# Patient Record
Sex: Female | Born: 1950 | Race: White | Hispanic: No | Marital: Single | Smoking: Never smoker
Health system: Southern US, Community
[De-identification: ages and names within clinical notes are randomized; demographics above are authoritative.]

## PROBLEM LIST (undated history)

## (undated) DIAGNOSIS — F40231 Fear of injections and transfusions: Secondary | ICD-10-CM

## (undated) DIAGNOSIS — I10 Essential (primary) hypertension: Secondary | ICD-10-CM

## (undated) DIAGNOSIS — E785 Hyperlipidemia, unspecified: Secondary | ICD-10-CM

## (undated) DIAGNOSIS — I639 Cerebral infarction, unspecified: Secondary | ICD-10-CM

## (undated) DIAGNOSIS — N289 Disorder of kidney and ureter, unspecified: Secondary | ICD-10-CM

## (undated) HISTORY — DX: Disorder of kidney and ureter, unspecified: N28.9

## (undated) HISTORY — DX: Hyperlipidemia, unspecified: E78.5

## (undated) HISTORY — PX: ABDOMINAL HYSTERECTOMY: SHX81

## (undated) HISTORY — DX: Fear of injections and transfusions: F40.231

## (undated) HISTORY — DX: Essential (primary) hypertension: I10

## (undated) HISTORY — DX: Cerebral infarction, unspecified: I63.9

## (undated) HISTORY — PX: KNEE ARTHROSCOPY W/ ACL RECONSTRUCTION: SHX1858

---

## 2017-12-15 ENCOUNTER — Ambulatory Visit (INDEPENDENT_AMBULATORY_CARE_PROVIDER_SITE_OTHER): Payer: Medicare Other | Admitting: Neurology

## 2017-12-15 ENCOUNTER — Encounter: Payer: Self-pay | Admitting: Neurology

## 2017-12-15 VITALS — BP 140/84 | HR 92 | Ht 63.0 in | Wt 140.2 lb

## 2017-12-15 DIAGNOSIS — I10 Essential (primary) hypertension: Secondary | ICD-10-CM

## 2017-12-15 DIAGNOSIS — I63512 Cerebral infarction due to unspecified occlusion or stenosis of left middle cerebral artery: Secondary | ICD-10-CM | POA: Diagnosis not present

## 2017-12-15 DIAGNOSIS — I679 Cerebrovascular disease, unspecified: Secondary | ICD-10-CM | POA: Diagnosis not present

## 2017-12-15 DIAGNOSIS — R432 Parageusia: Secondary | ICD-10-CM

## 2017-12-15 MED ORDER — ATORVASTATIN CALCIUM 20 MG PO TABS: 20.0000 mg | ORAL_TABLET | Freq: Every day | ORAL | 5 refills | Status: DC

## 2017-12-15 NOTE — Patient Instructions (Signed)
-   continue plavix and stop ASA  - will decrease lipitor from 80 to 20mg  - continue BP meds and check BP at home and record - Follow up with your primary care physician for stroke risk factor modification. Recommend maintain blood pressure goal <130/80, diabetes with hemoglobin A1c goal below 7.0% and lipids with LDL cholesterol goal below 70 mg/dL.  - will arrange for 30 day heart monitoring to rule out afib - eat more, eat healthy and keep active.  - follow up in 3 months.

## 2017-12-16 DIAGNOSIS — I10 Essential (primary) hypertension: Secondary | ICD-10-CM | POA: Insufficient documentation

## 2017-12-16 DIAGNOSIS — R432 Parageusia: Secondary | ICD-10-CM | POA: Insufficient documentation

## 2017-12-16 DIAGNOSIS — I63512 Cerebral infarction due to unspecified occlusion or stenosis of left middle cerebral artery: Secondary | ICD-10-CM | POA: Insufficient documentation

## 2017-12-16 NOTE — Progress Notes (Signed)
NEUROLOGY CLINIC NEW PATIENT NOTE  NAME: Denise Harding DOB: February 02, 1951 REFERRING PHYSICIAN: Bernerd PhoPodraza, Cole Christoph*  I saw Denise Harding as a new consult in the neurovascular clinic today regarding  Chief Complaint  Patient presents with  . New Patient (Initial Visit)    Referral from  Dr. Rudy Jewhristopher Cole Podraza [facility] last seen 05/21/2017 by  Denise Harding.[facility]. Pt concerned for possbile residual sympts of bitter tast in mouth, dry mouth,and abdnormal sensations in finger tips following her stroke Stroke was 12/2016  .  HPI: Denise Harding is a 67 y.o. female with PMH of HTN who presents as a new patient for a stroke.   on 05/09/2017, she was [facility] for some facial tingling and numbness and was admitted overnight. MRI negative for acute stroke and was diagnosed as TIA. On 05/13/17,she started having right sided weakness and speech difficulty. EMS was called and she was brought to the ER. In the ER, most of the motor symptoms had improved but she did have right sided drift. CT head showed infarction left caudate lobe. TPA was given and patient was admitted to the ICU. Patient had some headache at the end of the tPA infusion. Repeat CT scan of the head showed SAH along the sulci along the anterior and posterior hemisphere convexities on the left side. 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 CTA head and neck showed left M1 occlusion vs. High grade stenosis, right M1 mild to moderate stenosis. Otherwise not remarkable. A repeat CT scan was done and she had stable findings. Her antiplatelet was on hold pending outpatient neurology follow-up in 1 week. Blood pressures were well controlled after restarting amlodipine 10 mg. started on high intensity statin with Lipitor 80 mg.  She will [facility] and she started patient on Plavix and continued ASA and lipitor. Dr. Anne HahnWillis also referred her to rheumatology  as her ANA and ANCA was positive. She saw rheumatology later ruled out any lupus but concerning for afib. She had 24h holter monitoring which did not show afib. Dr. Anne HahnWillis later left the service so she is now referred here for stroke follow up.   Pt stated that over the last 6 months, she started to be easily bruising. Lost 40lbs as she does not have good appetite due to bitter taste and dry month which started after she takes ASA and plavix and lipitor. She has been on amlodipine for a long time, does not think amlodipine caused it.   She denies any hx of afib. She stated that at 9th grade, she had heart murmur and [facility] but did not find anything. Her BP at home stable 120-130/80. Today 140/82 in clinic. Denies smoking, alcohol or illicit drugs.  Past Medical History:  Diagnosis Date  . Hyperlipidemia   . Hypertension   . Renal insufficiency   . Severe needle phobia   . Stroke [facility](HCC)    History reviewed. No pertinent surgical history. History reviewed. No pertinent family history. Current Outpatient Medications  Medication Sig Dispense Refill  . amLODipine (NORVASC) 10 MG tablet Take 10 mg by mouth.    Marland Kitchen. amLODipine-benazepril (LOTREL) 10-20 MG capsule Take by mouth.    Marland Kitchen. aspirin EC 81 MG tablet Take by mouth.    Marland Kitchen. atorvastatin (LIPITOR) 20 MG tablet Take 1 tablet (20 mg total) by mouth daily at 6 PM. 30 tablet 5  . clopidogrel (PLAVIX) 75  MG tablet Take by mouth.    Tery Sanfilippo Sodium (COLACE PO) Take by mouth.     No current facility-administered medications for this visit.    No Known Allergies Social History   Socioeconomic History  . Marital status: Single    Spouse name: Not on file  . Number of children: Not on file  . Years of education: Not on file  . Highest education level: Not on file  Social Needs  . Financial resource strain: Not on file  . Food insecurity - worry: Not on file  . Food insecurity - inability: Not on file  . Transportation needs -  medical: Not on file  . Transportation needs - non-medical: Not on file  Occupational History  . Not on file  Tobacco Use  . Smoking status: Never Smoker  . Smokeless tobacco: Never Used  Substance and Sexual Activity  . Alcohol use: Not on file    Comment: occasionally  . Drug use: No  . Sexual activity: Not on file  Other Topics Concern  . Not on file  Social History Narrative  . Not on file    Review of Systems Full 14 system review of systems performed and notable only for those listed, all others are neg:  Constitutional:   Cardiovascular: Heart murmur Ear/Nose/Throat: Hearing loss, ringing in ears Skin:  Eyes:   Respiratory:   Gastroitestinal:   Genitourinary:  Hematology/Lymphatic:   Endocrine: Increased thirst Musculoskeletal:   Allergy/Immunology:   Neurological: Confusion, headache Psychiatric:  Sleep: Restless legs   Physical Exam  Vitals:   12/15/17 1100  BP: 140/84  Pulse: 92    General - Well nourished, well developed, in no apparent distress.  Ophthalmologic - Sharp disc margins OU.  Cardiovascular - Regular rate and rhythm with no murmur. Carotid pulses were 2+ without bruits .   Neck - supple, no nuchal rigidity .  Mental Status -  Level of arousal and orientation to time, place, and person were intact. Language including expression, naming, repetition, comprehension, reading, and writing was assessed and found intact. Attention span and concentration were normal. Recent and remote memory were intact. Fund of Knowledge was assessed and was intact.  Cranial Nerves II - XII - II - Visual field intact OU. III, IV, VI - Extraocular movements intact. V - Facial sensation intact bilaterally. VII - Facial movement intact bilaterally. VIII - Hearing & vestibular intact bilaterally. X - Palate elevates symmetrically. XI - Chin turning & shoulder shrug intact bilaterally. XII - Tongue protrusion intact.  Motor Strength - The patient's  strength was normal in all extremities and pronator drift was absent.  Bulk was normal and fasciculations were absent.   Motor Tone - Muscle tone was assessed at the neck and appendages and was normal.  Reflexes - The patient's reflexes were normal in all extremities and she had no pathological reflexes.  Sensory - Light touch, temperature/pinprick, vibration and proprioception, and Romberg testing were assessed and were normal.    Coordination - The patient had normal movements in the hands and feet with no ataxia or dysmetria.  Tremor was absent.  Gait and Station - The patient's transfers, posture, gait, station, and turns were observed as normal.   Imaging  I have personally reviewed the radiological images below and agree with the radiology interpretations.  MRI HEAD WITHOUT CONTRAST Patchy areas of acute infarct in the left basal ganglia and left occipital parietal white matter consistent with severe stenosis left middle cerebral artery on  CTA. Negative for hemorrhage.  CT head 05/13/17 1.  Stable to slightly improved trace SAH within the sulci along the left frontal and occipital complex disease 2.  No evidence of new intracranial hemorrhage or new acute abnormality 3.  Evolving low-attenuation lesions in the left BG consistent with known acute/subacute infarcts  CT Head Wo Contrast 05/13/17 1. Trace if any residual blood or hyperdensity in sulci along the left anterior and posterior hemisphere convexities. No other intracranial hemorrhage identified. 2. Minimal to mild hypodensity corresponding to the patchy left hemisphere acute infarcts seen by MRI today. 3. No new intracranial abnormality.   CTA head and neck 05/12/17 1.  Interval development of small volume of SAH over the left cerebral convexity 2.  Stable lucency in the left caudate head and anterior putamen suspicious for acute/subacute infarction 3.  No evidence for new large territory infarct in the brain or  significant mass-effect 4.  Severe distal left M1 stenosis.  Asymmetric mildly diminished left M2 and distal left MCA circulation compared with right probably due to flow limitation from severe distal left M1 stenosis 5.  Moderate proximal right M1 stenosis 6.  Patent carotid and vertebral arteries.  No large vessel occlusion, aneurysm, or significant stenosis is identified.  TTE  Ejection fraction is visually estimated at 55-60% Normal left ventricular systolic function Normal right ventricular size and function. No significant valvular abnormalities  Lab Review ANA 1:320 ANCA positive A1c 5.3 LDL 107    Assessment and Plan:   In summary, Merri Dimaano is a 67 y.o. female with PMH of HTN who presents as a new patient for a stroke on 05/13/17 with right sided weakness and speech difficulty. TPA was given and Repeat CT scan of the head showed SAH left frontal and occipital. 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 CTA head and neck showed left M1 occlusion vs. High grade stenosis, right M1 mild to moderate stenosis. EF 55-60%. LDL 107 and A1C 5.3. Repeat CT stable and SAH resolving. Discharged with Lipitor 80 mg. She followed with Dr. Anne Hahn in [facility] and she started patient on ASA and Plavix and continued lipitor. Dr. Anne Hahn also referred her to rheumatology as her ANA and ANCA was positive. She saw rheumatology later ruled out any lupus but concerning for afib. She had 24h holter monitoring which did not show afib.   Pt complains of 40lbs loss as she does not have good appetite due to bitter taste and dry month which started after she takes ASA and plavix and lipitor. This is more concerning for medication side effects, instead of any stroke concerns. will make medication changes. However, need to rule out afib with longer cardiac monitoring. Pt is needle phobia, will not do loop recorder.  - continue plavix and stop ASA  - will decrease lipitor from 80 to 20mg  - continue  BP meds and check BP at home and record - Follow up with your primary care physician for stroke risk factor modification. Recommend maintain blood pressure goal <130/80, diabetes with hemoglobin A1c goal below 7.0% and lipids with LDL cholesterol goal below 70 mg/dL.  - will arrange for 30 day heart monitoring to rule out afib - eat more, eat healthy and keep active.  - follow up in 3 months.   I recommend aggressive blood pressure control with a goal <130/80 mm Hg.  Lipids should be managed intensively, with a goal LDL < 70 mg/dL.  I encouraged the patient to discuss these important issues with  her primary care physician.  I counseled the patient on measures to reduce stroke risk, including the importance of medication compliance, risk factor control, exercise, healthy diet, and avoidance of smoking.  I reviewed stroke warning signs and symptoms and appropriate actions to take if such occurs.   Thank you very much for the opportunity to participate in the care of this patient.  Please do not hesitate to call if any questions or concerns arise.  No orders of the defined types were placed in this encounter.   Meds ordered this encounter  Medications  . atorvastatin (LIPITOR) 20 MG tablet    Sig: Take 1 tablet (20 mg total) by mouth daily at 6 PM.    Dispense:  30 tablet    Refill:  5    Patient Instructions  - continue plavix and stop ASA  - will decrease lipitor from 80 to 20mg  - continue BP meds and check BP at home and record - Follow up with your primary care physician for stroke risk factor modification. Recommend maintain blood pressure goal <130/80, diabetes with hemoglobin A1c goal below 7.0% and lipids with LDL cholesterol goal below 70 mg/dL.  - will arrange for 30 day heart monitoring to rule out afib - eat more, eat healthy and keep active.  - follow up in 3 months.    Marvel Plan, MD PhD [facility] [address] [place], [place] [address] [phone]

## 2018-01-06 ENCOUNTER — Telehealth: Payer: Self-pay | Admitting: Neurology

## 2018-01-06 DIAGNOSIS — R002 Palpitations: Secondary | ICD-10-CM

## 2018-01-06 NOTE — Telephone Encounter (Signed)
Patient schedule for cardiac event monitor on 01/19/2018.

## 2018-01-06 NOTE — Telephone Encounter (Signed)
Pt calling stating that Dr. Roda ShuttersXu wanted her to wear a heart monitor. In office notes it states "will arrange for 30 day heart monitoring to rule out afib" but pt hasnt heard anything else about it. Please contact to let her know the next steps

## 2018-01-06 NOTE — Telephone Encounter (Signed)
That is strange. I should have put in. Yes, we still need that to be done. Will re-order. Thanks.   Marvel PlanJindong Nirav Sweda, MD PhD Stroke Neurology 01/06/2018 3:48 PM  Orders Placed This Encounter  Procedures  . CARDIAC EVENT MONITOR    Standing Status:   Future    Standing Expiration Date:   01/07/2019    Scheduling Instructions:     Request cardionet setup. Thank you.    Order Specific Question:   Where should this test be performed?    Answer:   [facility]

## 2018-01-19 ENCOUNTER — Ambulatory Visit (INDEPENDENT_AMBULATORY_CARE_PROVIDER_SITE_OTHER): Payer: Medicare Other

## 2018-01-19 ENCOUNTER — Other Ambulatory Visit: Payer: Self-pay | Admitting: Neurology

## 2018-01-19 DIAGNOSIS — I4891 Unspecified atrial fibrillation: Secondary | ICD-10-CM

## 2018-01-19 DIAGNOSIS — R002 Palpitations: Secondary | ICD-10-CM

## 2018-01-19 DIAGNOSIS — I639 Cerebral infarction, unspecified: Secondary | ICD-10-CM

## 2018-02-25 ENCOUNTER — Telehealth: Payer: Self-pay

## 2018-02-25 NOTE — Telephone Encounter (Signed)
Left vm for patient to call back about cardiac monitor results. ------ 

## 2018-02-25 NOTE — Telephone Encounter (Signed)
Pt returning RN's call.

## 2018-02-25 NOTE — Telephone Encounter (Signed)
Rn call patient that her 30 day monitor showed no irregular heart beat, no atrial fibrillation. No medication changes. Continue treatment plan. Pt verbalized understanding. Pt wanted a copy mailed to her. Rn advised pt she has to sign medical records release form with medical records at our office to get a copy. PT states she will sign form at next visit to get copy. PT verbalized understanding. ------

## 2018-02-25 NOTE — Telephone Encounter (Signed)
-----   Message from Jindong Xu, MD sent at 02/24/2018 10:09 PM EDT ----- Could you please let the patient know that the 30 day heart monitoring test done recently showed no irregular heart beats called atrial fibrillation. No medication change needed. Please continue current treatment. Thanks.  Jindong Xu, MD PhD Stroke Neurology 02/24/2018 10:07 PM 

## 2018-02-25 NOTE — Telephone Encounter (Signed)
-----   Message from Marvel PlanJindong Xu, MD sent at 02/24/2018 10:09 PM EDT ----- Could you please let the patient know that the 30 day heart monitoring test done recently showed no irregular heart beats called atrial fibrillation. No medication change needed. Please continue current treatment. Thanks.  Marvel PlanJindong Xu, MD PhD Stroke Neurology 02/24/2018 10:07 PM

## 2018-03-22 ENCOUNTER — Ambulatory Visit (INDEPENDENT_AMBULATORY_CARE_PROVIDER_SITE_OTHER): Payer: Medicare Other | Admitting: Neurology

## 2018-03-22 ENCOUNTER — Encounter: Payer: Self-pay | Admitting: Neurology

## 2018-03-22 VITALS — BP 145/83 | HR 83 | Ht 63.0 in | Wt 137.2 lb

## 2018-03-22 DIAGNOSIS — E785 Hyperlipidemia, unspecified: Secondary | ICD-10-CM | POA: Diagnosis not present

## 2018-03-22 DIAGNOSIS — I639 Cerebral infarction, unspecified: Secondary | ICD-10-CM

## 2018-03-22 DIAGNOSIS — I10 Essential (primary) hypertension: Secondary | ICD-10-CM | POA: Diagnosis not present

## 2018-03-22 DIAGNOSIS — I63512 Cerebral infarction due to unspecified occlusion or stenosis of left middle cerebral artery: Secondary | ICD-10-CM

## 2018-03-22 NOTE — Patient Instructions (Signed)
-   continue plavix and lipitor for stroke prevention - continue BP meds and check BP at home and record - BP goal 130-150 given vessel narrowing in the brain - recommend BP cuff in arms instead of wrist to gain accuracy of the BP reading - Follow up with your primary care physician for stroke risk factor modification. Recommend maintain blood pressure goal <130/80, diabetes with hemoglobin A1c goal below 7.0% and lipids with LDL cholesterol goal below 70 mg/dL.  - healthy diet and regular exercise   - follow up in 6 months with Denise Harding.

## 2018-03-22 NOTE — Progress Notes (Signed)
NEUROLOGY CLINIC FOLLOW UP NOTE  NAME: Denise Harding DOB: June 20, 1951 REFERRING PHYSICIAN: Bernerd Pho*  I saw Denise Harding as a new consult in the neurovascular clinic today regarding  Chief Complaint  Patient presents with  . Follow-up    CVA follow up  .  HPI: Denise Harding is a 67 y.o. female with PMH of HTN who presents as a new patient for a stroke.   on 05/09/2017, she was treated in [facility] local hospital for some facial tingling and numbness and was admitted overnight. MRI negative for acute stroke and was diagnosed as TIA. On 05/13/17,she started having right sided weakness and speech difficulty. EMS was called and she was brought to the ER. In the ER, most of the motor symptoms had improved but she did have right sided drift. CT head showed infarction left caudate lobe. TPA was given and patient was admitted to the ICU. Patient had some headache at the end of the tPA infusion. Repeat CT scan of the head showed SAH along the sulci along the anterior and posterior hemisphere convexities on the left side. 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 CTA head and neck showed left M1 occlusion vs. High grade stenosis, right M1 mild to moderate stenosis. Otherwise not remarkable. A repeat CT scan was done and she had stable findings. Her antiplatelet was on hold pending outpatient neurology follow-up in 1 week. Blood pressures were well controlled after restarting amlodipine 10 mg. started on high intensity statin with Lipitor 80 mg.  She will follow with Dr. Anne Hahn in [facility] and she started patient on Plavix and continued ASA and lipitor. Dr. Anne Hahn also referred her to rheumatology as her ANA and ANCA was positive. She saw rheumatology later ruled out any lupus but concerning for afib. She had 24h holter monitoring which did not show afib. Dr. Anne Hahn later left the service so she is now referred here for stroke follow up.   Pt stated that over the last 6  months, she started to be easily bruising. Lost 40lbs as she does not have good appetite due to bitter taste and dry month which started after she takes ASA and plavix and lipitor. She has been on amlodipine for a long time, does not think amlodipine caused it.   She denies any hx of afib. She stated that at 9th grade, she had heart murmur and sent to [facility] but did not find anything. Her BP at home stable 120-130/80. Today 140/82 in clinic. Denies smoking, alcohol or illicit drugs.  Interval history: During the interval time, she has been doing well. No recurrent symptoms. Had 30 day cardiac event monitoring which was negative for afib although frequent PACs. Pt continues to refuse loop recorder. Compliant with plavix and lipitor. BP stable at home and today 145/83. Her taste some better than last time and her weight is stable at 135-140 lbs. No further weight loss. She follows with her PCP regularly.  Past Medical History:  Diagnosis Date  . Hyperlipidemia   . Hypertension   . Renal insufficiency   . Severe needle phobia   . Stroke [facility])    History reviewed. No pertinent surgical history. History reviewed. No pertinent family history. Current Outpatient Medications  Medication Sig Dispense Refill  . amLODipine-benazepril (LOTREL) 10-20 MG capsule Take by mouth.    Marland Kitchen atorvastatin (LIPITOR) 20 MG tablet Take 1 tablet (20 mg total) by mouth daily at 6 PM. 30 tablet 5  . clopidogrel (PLAVIX)  75 MG tablet Take by mouth.    Tery Sanfilippo Sodium (COLACE PO) Take by mouth.     No current facility-administered medications for this visit.    No Known Allergies Social History   Socioeconomic History  . Marital status: Single    Spouse name: Not on file  . Number of children: Not on file  . Years of education: Not on file  . Highest education level: Not on file  Occupational History  . Not on file  Social Needs  . Financial resource strain: Not on file  . Food insecurity:     Worry: Not on file    Inability: Not on file  . Transportation needs:    Medical: Not on file    Non-medical: Not on file  Tobacco Use  . Smoking status: Never Smoker  . Smokeless tobacco: Never Used  Substance and Sexual Activity  . Alcohol use: Not on file    Comment: occasionally  . Drug use: No  . Sexual activity: Not on file  Lifestyle  . Physical activity:    Days per week: Not on file    Minutes per session: Not on file  . Stress: Not on file  Relationships  . Social connections:    Talks on phone: Not on file    Gets together: Not on file    Attends religious service: Not on file    Active member of club or organization: Not on file    Attends meetings of clubs or organizations: Not on file    Relationship status: Not on file  . Intimate partner violence:    Fear of current or ex partner: Not on file    Emotionally abused: Not on file    Physically abused: Not on file    Forced sexual activity: Not on file  Other Topics Concern  . Not on file  Social History Narrative  . Not on file    Review of Systems Full 14 system review of systems performed and notable only for those listed, all others are neg:  Constitutional:   Cardiovascular:  Ear/Nose/Throat:  Skin:  Eyes:   Respiratory:   Gastroitestinal:   Genitourinary:  Hematology/Lymphatic:   Endocrine: Musculoskeletal:   Allergy/Immunology:   Neurological:  Psychiatric:  Sleep:    Physical Exam  Vitals:   03/22/18 0916  BP: (!) 145/83  Pulse: 83    General - Well nourished, well developed, in no apparent distress.  Ophthalmologic - Sharp disc margins OU.  Cardiovascular - Regular rate and rhythm with no murmur. Carotid pulses were 2+ without bruits .   Neck - supple, no nuchal rigidity .  Mental Status -  Level of arousal and orientation to time, place, and person were intact. Language including expression, naming, repetition, comprehension, reading, and writing was assessed and found  intact. Attention span and concentration were normal. Recent and remote memory were intact. Fund of Knowledge was assessed and was intact.  Cranial Nerves II - XII - II - Visual field intact OU. III, IV, VI - Extraocular movements intact. V - Facial sensation intact bilaterally. VII - Facial movement intact bilaterally. VIII - Hearing & vestibular intact bilaterally. X - Palate elevates symmetrically. XI - Chin turning & shoulder shrug intact bilaterally. XII - Tongue protrusion intact.  Motor Strength - The patient's strength was normal in all extremities and pronator drift was absent.  Bulk was normal and fasciculations were absent.   Motor Tone - Muscle tone was assessed at the  neck and appendages and was normal.  Reflexes - The patient's reflexes were normal in all extremities and she had no pathological reflexes.  Sensory - Light touch, temperature/pinprick, vibration and proprioception, and Romberg testing were assessed and were normal.    Coordination - The patient had normal movements in the hands and feet with no ataxia or dysmetria.  Tremor was absent.  Gait and Station - The patient's transfers, posture, gait, station, and turns were observed as normal.   Imaging  I have personally reviewed the radiological images below and agree with the radiology interpretations.  MRI HEAD WITHOUT CONTRAST Patchy areas of acute infarct in the left basal ganglia and left occipital parietal white matter consistent with severe stenosis left middle cerebral artery on CTA. Negative for hemorrhage.  CT head 05/13/17 1.  Stable to slightly improved trace SAH within the sulci along the left frontal and occipital complex disease 2.  No evidence of new intracranial hemorrhage or new acute abnormality 3.  Evolving low-attenuation lesions in the left BG consistent with known acute/subacute infarcts  CT Head Wo Contrast 05/13/17 1. Trace if any residual blood or hyperdensity in sulci along  the left anterior and posterior hemisphere convexities. No other intracranial hemorrhage identified. 2. Minimal to mild hypodensity corresponding to the patchy left hemisphere acute infarcts seen by MRI today. 3. No new intracranial abnormality.   CTA head and neck 05/12/17 1.  Interval development of small volume of SAH over the left cerebral convexity 2.  Stable lucency in the left caudate head and anterior putamen suspicious for acute/subacute infarction 3.  No evidence for new large territory infarct in the brain or significant mass-effect 4.  Severe distal left M1 stenosis.  Asymmetric mildly diminished left M2 and distal left MCA circulation compared with right probably due to flow limitation from severe distal left M1 stenosis 5.  Moderate proximal right M1 stenosis 6.  Patent carotid and vertebral arteries.  No large vessel occlusion, aneurysm, or significant stenosis is identified.  TTE  Ejection fraction is visually estimated at 55-60% Normal left ventricular systolic function Normal right ventricular size and function. No significant valvular abnormalities  30 day cardiac event monitoring Sinus rhythm Frequent premature atrial contractions No sustained arrhythmias No atrial fibrillation  Lab Review ANA 1:320 ANCA positive A1c 5.3 LDL 107    Assessment:   In summary, Niyah Mamaril is a 68 y.o. female with PMH of HTN who presents as a new patient for a stroke on 05/13/17 with right sided weakness and speech difficulty. TPA was given and Repeat CT scan of the head showed SAH left frontal and occipital. 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 CTA head and neck showed left M1 occlusion vs. High grade stenosis, right M1 mild to moderate stenosis. EF 55-60%. LDL 107 and A1C 5.3. Repeat CT stable and SAH resolving. Discharged with Lipitor 80 mg. She followed with Dr. Anne Hahn in [facility] and she started patient on ASA and Plavix and continued lipitor. Dr. Anne Hahn  also referred her to rheumatology as her ANA and ANCA was positive. She saw rheumatology later ruled out any lupus but concerning for afib. She had 24h holter monitoring which did not show afib and 30 day cardiac event monitoring also negative for afib although frequent PACs. Pt is needle phobia, refused loop recorder. BP stable and goal 130-150 due to intracranial stenosis. She stated lost most taste after stroke, but currently weight stable.   Plan: - continue plavix and lipitor for stroke  prevention - continue BP meds and check BP at home and record - BP goal 130-150 given vessel narrowing in the brain - recommend BP cuff in arms instead of wrist to gain accuracy of the BP reading - Follow up with your primary care physician for stroke risk factor modification. Recommend maintain blood pressure goal <130/80, diabetes with hemoglobin A1c goal below 7.0% and lipids with LDL cholesterol goal below 70 mg/dL.  - healthy diet and regular exercise   - follow up in 6 months with Shanda Bumps.    I spent more than 25 minutes of face to face time with the patient. Greater than 50% of time was spent in counseling and coordination of care. We discussed BP goal, medication compliance.    No orders of the defined types were placed in this encounter.   No orders of the defined types were placed in this encounter.   Patient Instructions  - continue plavix and lipitor for stroke prevention - continue BP meds and check BP at home and record - BP goal 130-150 given vessel narrowing in the brain - recommend BP cuff in arms instead of wrist to gain accuracy of the BP reading - Follow up with your primary care physician for stroke risk factor modification. Recommend maintain blood pressure goal <130/80, diabetes with hemoglobin A1c goal below 7.0% and lipids with LDL cholesterol goal below 70 mg/dL.  - healthy diet and regular exercise   - follow up in 6 months with Shanda Bumps.    Marvel Plan, MD PhD [facility] [address], [address] [place], [place] [address] [phone]

## 2018-09-21 ENCOUNTER — Ambulatory Visit: Payer: Medicare Other | Admitting: Adult Health

## 2021-03-17 ENCOUNTER — Encounter (HOSPITAL_BASED_OUTPATIENT_CLINIC_OR_DEPARTMENT_OTHER): Payer: Self-pay | Admitting: Emergency Medicine

## 2021-03-17 ENCOUNTER — Observation Stay (HOSPITAL_BASED_OUTPATIENT_CLINIC_OR_DEPARTMENT_OTHER)
Admission: EM | Admit: 2021-03-17 | Discharge: 2021-03-18 | Disposition: A | Discharge status: Home / Self Care | Payer: Medicare Other | Attending: Internal Medicine | Admitting: Internal Medicine

## 2021-03-17 ENCOUNTER — Emergency Department (HOSPITAL_BASED_OUTPATIENT_CLINIC_OR_DEPARTMENT_OTHER): Payer: Medicare Other

## 2021-03-17 ENCOUNTER — Other Ambulatory Visit: Payer: Self-pay

## 2021-03-17 DIAGNOSIS — I129 Hypertensive chronic kidney disease with stage 1 through stage 4 chronic kidney disease, or unspecified chronic kidney disease: Secondary | ICD-10-CM | POA: Diagnosis not present

## 2021-03-17 DIAGNOSIS — Z20822 Contact with and (suspected) exposure to covid-19: Secondary | ICD-10-CM | POA: Insufficient documentation

## 2021-03-17 DIAGNOSIS — N183 Chronic kidney disease, stage 3 unspecified: Secondary | ICD-10-CM | POA: Diagnosis not present

## 2021-03-17 DIAGNOSIS — G459 Transient cerebral ischemic attack, unspecified: Principal | ICD-10-CM | POA: Insufficient documentation

## 2021-03-17 DIAGNOSIS — I10 Essential (primary) hypertension: Secondary | ICD-10-CM | POA: Diagnosis not present

## 2021-03-17 DIAGNOSIS — Z79899 Other long term (current) drug therapy: Secondary | ICD-10-CM | POA: Diagnosis not present

## 2021-03-17 DIAGNOSIS — E785 Hyperlipidemia, unspecified: Secondary | ICD-10-CM | POA: Diagnosis present

## 2021-03-17 DIAGNOSIS — R479 Unspecified speech disturbances: Secondary | ICD-10-CM | POA: Diagnosis present

## 2021-03-17 LAB — CBC WITH DIFFERENTIAL/PLATELET
Abs Immature Granulocytes: 0.03 10*3/uL (ref 0.00–0.07)
Basophils Absolute: 0.1 10*3/uL (ref 0.0–0.1)
Basophils Relative: 1 %
Eosinophils Absolute: 0.1 10*3/uL (ref 0.0–0.5)
Eosinophils Relative: 1 %
HCT: 42.3 % (ref 36.0–46.0)
Hemoglobin: 14.8 g/dL (ref 12.0–15.0)
Immature Granulocytes: 0 %
Lymphocytes Relative: 10 %
Lymphs Abs: 0.9 10*3/uL (ref 0.7–4.0)
MCH: 31.4 pg (ref 26.0–34.0)
MCHC: 35 g/dL (ref 30.0–36.0)
MCV: 89.6 fL (ref 80.0–100.0)
Monocytes Absolute: 0.7 10*3/uL (ref 0.1–1.0)
Monocytes Relative: 8 %
Neutro Abs: 7.2 10*3/uL (ref 1.7–7.7)
Neutrophils Relative %: 80 %
Platelets: 336 10*3/uL (ref 150–400)
RBC: 4.72 MIL/uL (ref 3.87–5.11)
RDW: 12.8 % (ref 11.5–15.5)
WBC: 9 10*3/uL (ref 4.0–10.5)
nRBC: 0 % (ref 0.0–0.2)

## 2021-03-17 LAB — COMPREHENSIVE METABOLIC PANEL
ALT: 25 U/L (ref 0–44)
AST: 27 U/L (ref 15–41)
Albumin: 4.4 g/dL (ref 3.5–5.0)
Alkaline Phosphatase: 78 U/L (ref 38–126)
Anion gap: 13 (ref 5–15)
BUN: 18 mg/dL (ref 8–23)
CO2: 19 mmol/L — ABNORMAL LOW (ref 22–32)
Calcium: 9.4 mg/dL (ref 8.9–10.3)
Chloride: 106 mmol/L (ref 98–111)
Creatinine, Ser: 1.18 mg/dL — ABNORMAL HIGH (ref 0.44–1.00)
GFR, Estimated: 50 mL/min — ABNORMAL LOW (ref >60)
Glucose, Bld: 108 mg/dL — ABNORMAL HIGH (ref 70–99)
Potassium: 4 mmol/L (ref 3.5–5.1)
Sodium: 138 mmol/L (ref 135–145)
Total Bilirubin: 0.5 mg/dL (ref 0.3–1.2)
Total Protein: 7.2 g/dL (ref 6.5–8.1)

## 2021-03-17 LAB — URINALYSIS, ROUTINE W REFLEX MICROSCOPIC
Bilirubin Urine: NEGATIVE
Glucose, UA: NEGATIVE mg/dL
Hgb urine dipstick: NEGATIVE
Ketones, ur: NEGATIVE mg/dL
Leukocytes,Ua: NEGATIVE
Nitrite: NEGATIVE
Protein, ur: NEGATIVE mg/dL
Specific Gravity, Urine: 1.015 (ref 1.005–1.030)
pH: 5.5 (ref 5.0–8.0)

## 2021-03-17 LAB — RESP PANEL BY RT-PCR (FLU A&B, COVID) ARPGX2
Influenza A by PCR: NEGATIVE
Influenza B by PCR: NEGATIVE
SARS Coronavirus 2 by RT PCR: NEGATIVE

## 2021-03-17 MED ORDER — SODIUM CHLORIDE 0.9% FLUSH
3.0000 mL | Freq: Two times a day (BID) | INTRAVENOUS | Status: DC
  Administered 2021-03-18: 3 mL via INTRAVENOUS

## 2021-03-17 MED ORDER — SODIUM CHLORIDE 0.9% FLUSH: 3.0000 mL | INTRAVENOUS | Status: DC | PRN

## 2021-03-17 MED ORDER — CLOPIDOGREL BISULFATE 75 MG PO TABS
75.0000 mg | ORAL_TABLET | Freq: Every day | ORAL | Status: DC
  Administered 2021-03-18: 75 mg via ORAL
  Filled 2021-03-17: qty 1

## 2021-03-17 MED ORDER — SENNOSIDES-DOCUSATE SODIUM 8.6-50 MG PO TABS: 1.0000 | ORAL_TABLET | Freq: Every evening | ORAL | Status: DC | PRN

## 2021-03-17 MED ORDER — ACETAMINOPHEN 160 MG/5ML PO SOLN: 650.0000 mg | ORAL | Status: DC | PRN

## 2021-03-17 MED ORDER — ATORVASTATIN CALCIUM 10 MG PO TABS: 20.0000 mg | ORAL_TABLET | Freq: Every day | ORAL | Status: DC

## 2021-03-17 MED ORDER — STROKE: EARLY STAGES OF RECOVERY BOOK
Freq: Once | Status: AC
  Filled 2021-03-17: qty 1

## 2021-03-17 MED ORDER — BENAZEPRIL HCL 20 MG PO TABS
20.0000 mg | ORAL_TABLET | Freq: Every day | ORAL | Status: DC
  Administered 2021-03-18: 20 mg via ORAL
  Filled 2021-03-17: qty 1

## 2021-03-17 MED ORDER — SODIUM CHLORIDE 0.9 % IV SOLN: 250.0000 mL | INTRAVENOUS | Status: DC | PRN

## 2021-03-17 MED ORDER — ACETAMINOPHEN 650 MG RE SUPP: 650.0000 mg | RECTAL | Status: DC | PRN

## 2021-03-17 MED ORDER — ACETAMINOPHEN 325 MG PO TABS: 650.0000 mg | ORAL_TABLET | ORAL | Status: DC | PRN

## 2021-03-17 MED ORDER — AMLODIPINE BESYLATE 10 MG PO TABS
10.0000 mg | ORAL_TABLET | Freq: Every day | ORAL | Status: DC
  Administered 2021-03-18: 10 mg via ORAL
  Filled 2021-03-17: qty 1

## 2021-03-17 NOTE — ED Notes (Signed)
Patient transported to CT 

## 2021-03-17 NOTE — ED Notes (Signed)
Pt given snack with verbal approval from Dr. Clarene Duke

## 2021-03-17 NOTE — ED Notes (Signed)
MD aware of pt status-orders placed by him. MSE waiver deferred

## 2021-03-17 NOTE — ED Notes (Signed)
ED Provider at bedside. Dr. Little 

## 2021-03-17 NOTE — Consult Note (Signed)
NEURO HOSPITALIST CONSULT NOTE   Requestig physician: Dr. Lorin Mercy  Reason for Consult: Transient speech deficit  History obtained from:  Patient and Chart    HPI:                                                                                                                                          Denise Harding is an 70 y.o. female with a PMHx of HLD, HTN, CKD 3 and prior stroke in 2018 who presented to the [place] ED on Sunday afternoon after she experienced expressive aphasia lasting for about 10 minutes. Symptoms occurred while she was walking her dog. Fellow dog-walkers in her neighborhood met her along her walk and when she tried to speak, she noticed that she could not get the words out correctly. She could understand what was being said to her, but her speech output was nonsensical according to her neighbors. Symptoms started at about 10:30 AM.   Upon arrival to the ED, her speech was back to baseline. She states that she had similar symptoms with her stroke in 2018.   At the [facility] ED, she denied having had any N/V, headache, changes of vision, focal numbness, focal weakness or ataxia.   CT head showed no acute intracranial abnormality. 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   After arriving to [facility], her speech continued to be at baseline. MRI brain showed no acute intracranial abnormality. OId left basal ganglia and parietal lobe infarcts were noted.  EKG: NSR  Past Medical History:  Diagnosis Date  . Hyperlipidemia   . Hypertension   . Renal insufficiency   . Severe needle phobia   . Stroke [facility])     Past Surgical History:  Procedure Laterality Date  . ABDOMINAL HYSTERECTOMY    . KNEE ARTHROSCOPY W/ ACL RECONSTRUCTION Right     History reviewed. No pertinent family history.              Social History:  reports that she has never smoked. She has never used smokeless tobacco. She reports that she does not use drugs. No history on file for alcohol use.  No Known Allergies  MEDICATIONS:  No current facility-administered medications on file prior to encounter.   Current Outpatient Medications on File Prior to Encounter  Medication Sig Dispense Refill  . amLODipine-benazepril (LOTREL) 10-20 MG capsule Take by mouth.    Marland Kitchen atorvastatin (LIPITOR) 20 MG tablet Take 1 tablet (20 mg total) by mouth daily at 6 PM. 30 tablet 5  . Docusate Sodium (COLACE PO) Take by mouth.      - Plavix 75 mg po qd   Scheduled: .  stroke: mapping our early stages of recovery book   Does not apply Once  . amLODipine  10 mg Oral Daily  . atorvastatin  20 mg Oral q1800  . benazepril  20 mg Oral Daily  . clopidogrel  75 mg Oral Daily  . sodium chloride flush  3 mL Intravenous Q12H   Continuous: . sodium chloride       ROS:                                                                                                                                       Has had no other symptoms in conjunction with her transient aphasia, including no vision loss, facial droop, sensory numbness, limb weakness or difficulty ambulating. Other ROS as per HPI.    Blood pressure 134/78, pulse 75, temperature (!) 97 F (36.1 C), temperature source Oral, resp. rate 18, height 5' 2"  (1.575 m), weight 63.5 kg, SpO2 97 %.   General Examination:                                                                                                       Physical Exam  HEENT-  [place]/AT    Lungs- Respirations unlabored Extremities- No edema  Neurological Examination Mental Status: Alert, fully oriented, thought content appropriate.  Speech fluent with intact comprehension and naming for common and less common  words. No dysarthria. Repetition intact. Able to recite the months of the year forwards and backwards without error. Oriented x 5.  Cranial Nerves: II: Visual fields intact with no extinction to DSS. PERRL.   III,IV, VI: No ptosis. EOMI. No nystagmus.   V,VII: Smile symmetric, facial temp sensation equal bilaterally VIII: Hearing intact to voice IX,X: No hypophonia  XI: Symmetric XII: Midline tongue extension Motor: Right : Upper extremity   5/5    Left:     Upper extremity   5/5  Lower extremity   5/5     Lower extremity   5/5 No pronator drift Sensory:  Temp and light touch intact throughout, bilaterally Deep Tendon Reflexes: 2+ and symmetric throughout Cerebellar: No ataxia with FNF bilaterally  Gait: Deferred   Lab Results: Basic Metabolic Panel: Recent Labs  Lab 03/17/21 1450  NA 138  K 4.0  CL 106  CO2 19*  GLUCOSE 108*  BUN 18  CREATININE 1.18*  CALCIUM 9.4    CBC: Recent Labs  Lab 03/17/21 1450  WBC 9.0  NEUTROABS 7.2  HGB 14.8  HCT 42.3  MCV 89.6  PLT 336    Cardiac Enzymes: No results for input(s): CKTOTAL, CKMB, CKMBINDEX, TROPONINI in the last 168 hours.  Lipid Panel: No results for input(s): CHOL, TRIG, HDL, CHOLHDL, VLDL, LDLCALC in the last 168 hours.  Imaging: CT HEAD WO CONTRAST  Result Date: 03/17/2021 CLINICAL DATA:  Concern for transient ischemic attack, difficulty talking earlier today EXAM: CT HEAD WITHOUT CONTRAST TECHNIQUE: Contiguous axial images were obtained from the base of the skull through the vertex without intravenous contrast. COMPARISON:  Head CT July 18, 2019 FINDINGS: Brain: No evidence of acute large vascular territory infarction, hemorrhage, hydrocephalus, extra-axial collection or mass lesion/mass effect. Stable age related global parenchymal volume loss. Unchanged slight enlargement of the left lateral ventricle in comparison to the right. Prior infarcts involving the anterior limb of the left internal and external  capsules extending into the adjacent left superior lentiform nucleus and centrum semiovale. 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 Similar mild burden of chronic microvascular ischemic white matter disease. Vascular: No hyperdense vessel. Atherosclerotic calcifications of the internal carotid arteries at the skull base. Skull: Normal. Negative for fracture or focal lesion. Sinuses/Orbits: The paranasal sinuses and mastoid air cells are predominantly clear. Orbits are grossly unremarkable. Other: None IMPRESSION: 1. No acute intracranial abnormality. 2. 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 Electronically Signed   By: Dahlia Bailiff MD   On: 03/17/2021 15:14    Assessment: 70 year old female presenting after a spell of transient aphasia  1. Exam is normal.  2. Imaging studies show no acute stroke. Old infarctions are noted.  3. Most likely etiology for her presentation is TIA. Complex partial seizure is also on the DDx, but is felt to be unlikely.  4. Stroke risk factors: HLD, HTN, CKD and prior stroke.  Recommendations: 1. Continue home Plavix and Lipitor. 2. Add ASA 3. TTE 4. MRA of head 5. Carotid ultrasound 6. Cardiac telemetry 7. PT/OT/Speech 8. Permissive HTN x 24 hours. 9. EEG in AM 10. Magnesium level  Electronically signed: Dr. Kerney Elbe 03/17/2021, 10:37 PM

## 2021-03-17 NOTE — ED Notes (Signed)
Dr. Lockie Mola made aware of pt's status and c/o

## 2021-03-17 NOTE — ED Triage Notes (Addendum)
States was about 1030ish this am while walking dog, she had trouble talking . Episode lasted about 10 min. Speech clear  Presently, prior CVA 2018. EMS was on scene  and was told she was "ok"

## 2021-03-17 NOTE — ED Provider Notes (Addendum)
[facility] EMERGENCY DEPARTMENT Provider Note   CSN: [id number] Arrival date & time: 03/17/21  1415     History Chief Complaint  Patient presents with  . Altered Mental Status    Denise Harding is a 70 y.o. female.  70 year old female with past medical history below including CVA, hypertension, hyperlipidemia, CKD who presents with speech problems.  Around 1045 this morning, patient was walking her dog with her neighbor friends when she suddenly began having nonsensical speech according to neighbors.  Patient states that she was able to understand her neighbors but was not able to get the words out correctly.  Episode lasted approximately 10 minutes then resolved completely and has not reoccurred.  She denies any associated headache, vision changes, nausea/vomiting, focal numbness/weakness, ataxia, or other complaints.  Currently she feels at baseline.  She is compliant with medications since previous stroke in 2018 and has not had problems since then.  No recent illness.  The history is provided by the patient and a friend.  Altered Mental Status      Past Medical History:  Diagnosis Date  . Hyperlipidemia   . Hypertension   . Renal insufficiency   . Severe needle phobia   . Stroke [facility])     Patient Active Problem List   Diagnosis Date Noted  . Hyperlipidemia 03/22/2018  . Abnormal taste in mouth 12/16/2017  . Essential hypertension 12/16/2017  . 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 ABDOMINAL HYSTERECTOMY    . KNEE ARTHROSCOPY W/ ACL RECONSTRUCTION Right      OB History   No obstetric history on file.     No family history on file.  Social History   Tobacco Use  . Smoking status: Never Smoker  . Smokeless tobacco: Never Used  Substance Use Topics  . Drug use: No    Home Medications Prior to Admission medications   Medication Sig  Start Date End Date Taking? Authorizing Provider  amLODipine-benazepril (LOTREL) 10-20 MG capsule Take by mouth. 12/03/17 12/03/18  [provider]  atorvastatin (LIPITOR) 20 MG tablet Take 1 tablet (20 mg total) by mouth daily at 6 PM. 12/15/17   Marvel Plan, MD  Docusate Sodium (COLACE PO) Take by mouth.    [provider]    Allergies    Patient has no known allergies.  Review of Systems   Review of Systems All other systems reviewed and are negative except that which was mentioned in HPI  Physical Exam Updated Vital Signs BP 130/89   Pulse 83   Temp 98 F (36.7 C) (Oral)   Resp (!) 25   Ht 5\' 2"  (1.575 m)   Wt 63.5 kg   LMP  (LMP Unknown)   SpO2 99%   BMI 25.62 kg/m   Physical Exam Vitals and nursing note reviewed.  Constitutional:      General: She is not in acute distress.    Appearance: She is well-developed.     Comments: Awake, alert  HENT:     Head: Normocephalic and atraumatic.  Eyes:     Extraocular Movements: Extraocular movements intact.     Conjunctiva/sclera: Conjunctivae normal.     Pupils: Pupils are equal, round, and reactive to light.  Cardiovascular:     Rate and Rhythm: Normal rate and regular rhythm.     Heart sounds: Murmur heard.    Pulmonary:  Effort: Pulmonary effort is normal. No respiratory distress.     Breath sounds: Normal breath sounds.  Abdominal:     General: Bowel sounds are normal. There is no distension.     Palpations: Abdomen is soft.     Tenderness: There is no abdominal tenderness.  Musculoskeletal:     Cervical back: Neck supple.  Skin:    General: Skin is warm and dry.  Neurological:     Mental Status: She is alert and oriented to person, place, and time.     Cranial Nerves: No cranial nerve deficit.     Motor: No abnormal muscle tone.     Deep Tendon Reflexes: Reflexes are normal and symmetric.     Comments: Fluent speech, normal finger-to-nose testing, negative pronator drift, no clonus 5/5  strength and normal sensation x all 4 extremities  Psychiatric:        Thought Content: Thought content normal.        Judgment: Judgment normal.     ED Results / Procedures / Treatments   Labs (all labs ordered are listed, but only abnormal results are displayed) Labs Reviewed  COMPREHENSIVE METABOLIC PANEL - Abnormal; Notable for the following components:      Result Value   CO2 19 (*)    Glucose, Bld 108 (*)    Creatinine, Ser 1.18 (*)    GFR, Estimated 50 (*)    All other components within normal limits  RESP PANEL BY RT-PCR (FLU A&B, COVID) [id number]  URINALYSIS, ROUTINE W REFLEX MICROSCOPIC  CBC WITH DIFFERENTIAL/PLATELET    EKG EKG Interpretation  Date/Time:  Sunday March 17 2021 14:32:38 EDT Ventricular Rate:  89 PR Interval:  172 QRS Duration: 87 QT Interval:  360 QTC Calculation: 438 R Axis:   70 Text Interpretation: Sinus rhythm Confirmed by Virgina Norfolk ([id number]) on 03/17/2021 2:34:27 PM   Radiology CT HEAD WO CONTRAST  Result Date: 03/17/2021 CLINICAL DATA:  Concern for transient ischemic attack, difficulty talking earlier today EXAM: CT HEAD WITHOUT CONTRAST TECHNIQUE: Contiguous axial images were obtained from the base of the skull through the vertex without intravenous contrast. COMPARISON:  Head CT July 18, 2019 FINDINGS: Brain: No evidence of acute large vascular territory infarction, hemorrhage, hydrocephalus, extra-axial collection or mass lesion/mass effect. Stable age related global parenchymal volume loss. Unchanged slight enlargement of the left lateral ventricle in comparison to the right. Prior infarcts involving the anterior limb of the left internal and external capsules extending into the adjacent left superior lentiform nucleus and centrum semiovale. 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 Similar mild burden of chronic microvascular ischemic white matter disease. Vascular: No hyperdense vessel. Atherosclerotic calcifications  of the internal carotid arteries at the skull base. Skull: Normal. Negative for fracture or focal lesion. Sinuses/Orbits: The paranasal sinuses and mastoid air cells are predominantly clear. Orbits are grossly unremarkable. Other: None IMPRESSION: 1. No acute intracranial abnormality. 2. 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 Electronically Signed   By: Maudry Mayhew MD   On: 03/17/2021 15:14    Procedures Procedures   Medications Ordered in ED Medications - No data to display  ED Course  I have reviewed the triage vital signs and the nursing notes.  Pertinent labs & imaging results that were available during my care of the patient were reviewed by me and considered in my medical decision making (  see chart for details).    MDM Rules/Calculators/A&P                          Alert, neurologically intact on exam with no focal neurologic deficits, fluent speech, and reassuring vital signs.  I am concerned about the possibility of TIA versus focal seizure.  CT negative acute, shows areas of chronic infarct from previous stroke.  Discussed with neurology, Dr. Iver Nestle, who recommended admission for TIA vs seizure w/u. Discussed w/ [facility], Dr. Ophelia Charter, who will admit for further care. Final Clinical Impression(s) / ED Diagnoses Final diagnoses:  TIA (transient ischemic attack)    Rx / DC Orders ED Discharge Orders    None       Faron Whitelock, Ambrose Finland, MD 03/17/21 1643    Nessie Nong, Ambrose Finland, MD 03/17/21 [phone]

## 2021-03-17 NOTE — H&P (Signed)
History and Physical    Denise Harding XBM:[id number] DOB: [birth year] DOA: 03/17/2021  PCP: Bailey Mech, PA-C   Patient coming from:  Home via [facility] ER  Chief Complaint:  Difficulty speaking  HPI: Denise Harding is a 70 y.o. female with medical history significant for HTN, HLD, CKD 3, hx of CVA in 2018 who presents with episode of difficulty speaking today. She was walking her dog with another couple this afternoon when she had a sudden onset of inability to speak.  She states that she tried to talk to her neighbor and answer a question but she could only make nonsensical gibberish sounds.  Symptoms lasted for 10 minutes and then completely resolved.  She is now at her baseline and can talk normally.  She did not have any tremors or jerking of her extremities.  She did not have any slurred speech, drooping face, drooping eyelid or numbness or weakness of extremities.  She had no loss of consciousness or loss of bowel or bladder control.  She had a small stroke in 2018 that has no residual deficit she states.  She has been compliant with her medications of Lotrel, Lipitor and Plavix.  She has not had any recent fever or illness.   ED Course: Patient been hemodynamically stable in the emergency room.  She has no recurrence of symptoms.  Head CT was negative.  ER physician discussed with neurology recommended patient be placed in the hospital for stroke/TIA work-up with MRI and also an EEG to rule out seizure with her symptoms.  Review of Systems:  General: Denies weakness, fever, chills, weight loss, night sweats.  Denies dizziness.  Denies change in appetite HENT: Denies head trauma, headache, denies change in hearing, tinnitus.  Denies nasal congestion or bleeding.  Denies sore throat, sores in mouth.  Denies difficulty swallowing Eyes: Denies blurry vision, pain in eye, drainage.  Denies discoloration of eyes. Neck: Denies pain.  Denies swelling.  Denies pain with  movement. Cardiovascular: Denies chest pain, palpitations.  Denies edema.  Denies orthopnea Respiratory: Denies shortness of breath, cough.  Denies wheezing.  Denies sputum production Gastrointestinal: Denies abdominal pain, swelling.  Denies nausea, vomiting, diarrhea.  Denies melena.  Denies hematemesis. Musculoskeletal: Denies limitation of movement.  Denies deformity or swelling.  Denies pain.  Denies arthralgias or myalgias. Genitourinary: Denies pelvic pain.  Denies urinary frequency or hesitancy.  Denies dysuria.  Skin: Denies rash.  Denies petechiae, purpura, ecchymosis. Neurological: Denies headache.  Denies syncope.  Denies seizure activity.  Denies weakness or paresthesia.  Denies slurred speech, drooping face.  Denies visual change. Psychiatric: Denies depression, anxiety.  Denies suicidal thoughts or ideation.  Denies hallucinations.  Past Medical History:  Diagnosis Date  . Hyperlipidemia   . Hypertension   . Renal insufficiency   . Severe needle phobia   . Stroke [facility])     Past Surgical History:  Procedure Laterality Date  . ABDOMINAL HYSTERECTOMY    . KNEE ARTHROSCOPY W/ ACL RECONSTRUCTION Right     Social History  reports that she has never smoked. She has never used smokeless tobacco. She reports that she does not use drugs. No history on file for alcohol use.  No Known Allergies  History reviewed. No pertinent family history.   Prior to Admission medications   Medication Sig Start Date End Date Taking? Authorizing Provider  amLODipine-benazepril (LOTREL) 10-20 MG capsule Take by mouth. 12/03/17 12/03/18  [provider]  atorvastatin (LIPITOR) 20 MG tablet Take 1 tablet (20 mg  total) by mouth daily at 6 PM. 12/15/17   Marvel Plan, MD  Docusate Sodium (COLACE PO) Take by mouth.    [provider]    Physical Exam: Vitals:   03/17/21 1730 03/17/21 1900 03/17/21 2026 03/17/21 2100  BP: 112/73 123/75 (!) 141/74 134/78  Pulse: 85 87 82 75   Resp: (!) 21 16 17 18   Temp:   98.2 F (36.8 C) (!) 97 F (36.1 C)  TempSrc:   Oral Oral  SpO2: 100% 100% 100% 97%  Weight:      Height:        Constitutional: NAD, calm, comfortable Vitals:   03/17/21 1730 03/17/21 1900 03/17/21 2026 03/17/21 2100  BP: 112/73 123/75 (!) 141/74 134/78  Pulse: 85 87 82 75  Resp: (!) 21 16 17 18   Temp:   98.2 F (36.8 C) (!) 97 F (36.1 C)  TempSrc:   Oral Oral  SpO2: 100% 100% 100% 97%  Weight:      Height:       General: WDWN, Alert and oriented x3.  Eyes: EOMI, PERRL, conjunctivae normal.  Sclera nonicteric HENT:  [place]/AT, external ears normal.  Nares patent without epistasis.  Mucous membranes are moist  Neck: Soft, normal range of motion, supple, no masses, no thyromegaly.  Trachea midline Respiratory: clear to auscultation bilaterally, no wheezing, no crackles. Normal respiratory effort. No accessory muscle use.  Cardiovascular: Regular rate and rhythm, no murmurs / rubs / gallops. No extremity edema. 2+ pedal pulses. No carotid bruits.  Abdomen: Soft, no tenderness, nondistended, no rebound or guarding.  No masses palpated. Bowel sounds normoactive Musculoskeletal: FROM. no cyanosis. No joint deformity upper and lower extremities. Normal muscle tone.  Skin: Warm, dry, intact no rashes, lesions, ulcers. No induration Neurologic: CN 2-12 grossly intact.  Normal speech. Sensation intact, patella DTR +1 bilaterally. Strength 5/5 in all extremities.  No pronator drift.  Normal finger-to-nose.  No tremor. Psychiatric: Normal judgment and insight.  Normal mood.    Labs on Admission: I have personally reviewed following labs and imaging studies  CBC: Recent Labs  Lab 03/17/21 1450  WBC 9.0  NEUTROABS 7.2  HGB 14.8  HCT 42.3  MCV 89.6  PLT 336    Basic Metabolic Panel: Recent Labs  Lab 03/17/21 1450  NA 138  K 4.0  CL 106  CO2 19*  GLUCOSE 108*  BUN 18  CREATININE 1.18*  CALCIUM 9.4    GFR: Estimated Creatinine Clearance:  39.4 mL/min (A) (by C-G formula based on SCr of 1.18 mg/dL (H)).  Liver Function Tests: Recent Labs  Lab 03/17/21 1450  AST 27  ALT 25  ALKPHOS 78  BILITOT 0.5  PROT 7.2  ALBUMIN 4.4    Urine analysis:    Component Value Date/Time   COLORURINE YELLOW 03/17/2021 1600   APPEARANCEUR CLEAR 03/17/2021 1600   LABSPEC 1.015 03/17/2021 1600   PHURINE 5.5 03/17/2021 1600   GLUCOSEU NEGATIVE 03/17/2021 1600   HGBUR NEGATIVE 03/17/2021 1600   BILIRUBINUR NEGATIVE 03/17/2021 1600   KETONESUR NEGATIVE 03/17/2021 1600   PROTEINUR NEGATIVE 03/17/2021 1600   NITRITE NEGATIVE 03/17/2021 1600   LEUKOCYTESUR NEGATIVE 03/17/2021 1600    Radiological Exams on Admission: CT HEAD WO CONTRAST  Result Date: 03/17/2021 CLINICAL DATA:  Concern for transient ischemic attack, difficulty talking earlier today EXAM: CT HEAD WITHOUT CONTRAST TECHNIQUE: Contiguous axial images were obtained from the base of the skull through the vertex without intravenous contrast. COMPARISON:  Head CT July 18, 2019  FINDINGS: Brain: No evidence of acute large vascular territory infarction, hemorrhage, hydrocephalus, extra-axial collection or mass lesion/mass effect. Stable age related global parenchymal volume loss. Unchanged slight enlargement of the left lateral ventricle in comparison to the right. Prior infarcts involving the anterior limb of the left internal and external capsules extending into the adjacent left superior lentiform nucleus and centrum semiovale. 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 Similar mild burden of chronic microvascular ischemic white matter disease. Vascular: No hyperdense vessel. Atherosclerotic calcifications of the internal carotid arteries at the skull base. Skull: Normal. Negative for fracture or focal lesion. Sinuses/Orbits: The paranasal sinuses and mastoid air cells are predominantly clear. Orbits are grossly unremarkable. Other: None IMPRESSION: 1. No acute  intracranial abnormality. 2. 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 Electronically Signed   By: Maudry Mayhew MD   On: 03/17/2021 15:14    EKG: Independently reviewed.  EKG shows normal sinus rhythm with no ST elevation or depression.  QTc 438  Assessment/Plan Principal Problem:   TIA (transient ischemic attack) Patient be observed on medical telemetry floor for TIA/CVA.  Will obtain MRI of brain to rule out acute ischemic CVA.  Will evaluate carotids for large vessel occlusion/stenosis. Obtain echocardiogram to evaluate for PFO, wall motion and ejection fraction. Hypertension of 220/110 will be allowed for 24 hours per stroke protocol.  After which blood pressure will be slowly reduced to goal level. Antiplatelet therapy with aspirin daily. Continue statin therapy.  Check lipid panel.  Neurochecks per stroke protocol  Active Problems:   Essential hypertension Continue Lotrel. Monitor BP    Hyperlipidemia Continue lipitor.    CKD (chronic kidney disease) stage 3, GFR 30-59 ml/min Stable. Recheck electrolytes and renal function in am.      DVT prophylaxis:    SCDs for DVT prophylaxis.  Code Status:   Full code  Family Communication:  Diagnosis and plan discussed with patient.  Patient verbalized understanding agrees with plan.  Further recommendations to follow as clinically indicated Disposition Plan:   Patient is from:  Home  Anticipated DC to:  Home  Anticipated DC date:  Anticipate less than 2 midnight stay in the hospital  Anticipated DC barriers:         no barriers to discharge identified at this time  Admission status:  Observation  Carlton Adam MD [facility]  How to contact the [facility] Attending or Consulting provider 7A - 7P or covering provider during after hours 7P -7A, for this patient?   1. Check the  care team in [facility] and look for a) attending/consulting [facility] provider listed and b) the [facility] team listed 2. Log into [email or web address] and use [place]'s universal password to access. If you do not have the password, please contact the hospital operator. 3. Locate the [facility] provider you are looking for under [facility] and page to a number that you can be directly reached. 4. If you still have difficulty reaching the provider, please page the [facility] (Director on Call) for the Hospitalists listed on amion for assistance.  03/17/2021, 10:23 PM

## 2021-03-17 NOTE — Progress Notes (Signed)
[facility] to [facility] transfer:  Patient with h/o CVA (2018); HTN; stage 3 CKD; and HLD presenting with transient expressive aphasia, now resolved.   Episode of speaking nonsense while walking her dog - able to understand and alert and oriented but expressive aphasia for about 10 minutes.  Completely resolved.  Head CT negative.  Dr. Iver Nestle recommends observation for TIA vs. Focal seizure, needs MRI and EEG.  Will place in Med Tele Observation.   Georgana Curio, M.D.

## 2021-03-17 NOTE — ED Notes (Signed)
Handoff report given to [facility] RN

## 2021-03-17 NOTE — ED Notes (Signed)
[facility] at bedside 

## 2021-03-17 NOTE — ED Notes (Signed)
Hand-off report given to [facility].  

## 2021-03-18 ENCOUNTER — Other Ambulatory Visit (HOSPITAL_COMMUNITY): Payer: Self-pay

## 2021-03-18 ENCOUNTER — Observation Stay (HOSPITAL_COMMUNITY): Payer: Medicare Other

## 2021-03-18 ENCOUNTER — Observation Stay (HOSPITAL_BASED_OUTPATIENT_CLINIC_OR_DEPARTMENT_OTHER): Payer: Medicare Other

## 2021-03-18 ENCOUNTER — Other Ambulatory Visit: Payer: Self-pay | Admitting: Physician Assistant

## 2021-03-18 DIAGNOSIS — Z20822 Contact with and (suspected) exposure to covid-19: Secondary | ICD-10-CM | POA: Diagnosis not present

## 2021-03-18 DIAGNOSIS — I63512 Cerebral infarction due to unspecified occlusion or stenosis of left middle cerebral artery: Secondary | ICD-10-CM

## 2021-03-18 DIAGNOSIS — I10 Essential (primary) hypertension: Secondary | ICD-10-CM

## 2021-03-18 DIAGNOSIS — G459 Transient cerebral ischemic attack, unspecified: Secondary | ICD-10-CM

## 2021-03-18 DIAGNOSIS — N183 Chronic kidney disease, stage 3 unspecified: Secondary | ICD-10-CM | POA: Diagnosis not present

## 2021-03-18 DIAGNOSIS — I129 Hypertensive chronic kidney disease with stage 1 through stage 4 chronic kidney disease, or unspecified chronic kidney disease: Secondary | ICD-10-CM | POA: Diagnosis not present

## 2021-03-18 DIAGNOSIS — E785 Hyperlipidemia, unspecified: Secondary | ICD-10-CM | POA: Diagnosis not present

## 2021-03-18 DIAGNOSIS — R569 Unspecified convulsions: Secondary | ICD-10-CM | POA: Diagnosis not present

## 2021-03-18 LAB — ECHOCARDIOGRAM COMPLETE
Area-P 1/2: 3.17 cm2
Height: 62 in
S' Lateral: 1.8 cm
Weight: 2241.6 oz

## 2021-03-18 MED ORDER — IOHEXOL 350 MG/ML SOLN
75.0000 mL | Freq: Once | INTRAVENOUS | Status: AC | PRN
  Administered 2021-03-18: 75 mL via INTRAVENOUS

## 2021-03-18 MED ORDER — TICAGRELOR 90 MG PO TABS
90.0000 mg | ORAL_TABLET | Freq: Two times a day (BID) | ORAL | 0 refills | Status: AC
  Filled 2021-03-18: qty 60, 30d supply, fill #0

## 2021-03-18 MED ORDER — ATORVASTATIN CALCIUM 40 MG PO TABS
40.0000 mg | ORAL_TABLET | Freq: Every day | ORAL | 0 refills | Status: AC
  Filled 2021-03-18: qty 30, 30d supply, fill #0

## 2021-03-18 MED ORDER — ATORVASTATIN CALCIUM 40 MG PO TABS: 40.0000 mg | ORAL_TABLET | Freq: Every day | ORAL | Status: DC

## 2021-03-18 MED ORDER — TICAGRELOR 90 MG PO TABS: 90.0000 mg | ORAL_TABLET | Freq: Two times a day (BID) | ORAL | Status: DC

## 2021-03-18 MED ORDER — ASPIRIN EC 81 MG PO TBEC
81.0000 mg | DELAYED_RELEASE_TABLET | Freq: Every day | ORAL | Status: DC
  Administered 2021-03-18: 81 mg via ORAL
  Filled 2021-03-18: qty 1

## 2021-03-18 MED ORDER — TICAGRELOR 90 MG PO TABS
180.0000 mg | ORAL_TABLET | Freq: Once | ORAL | Status: AC
  Administered 2021-03-18: 180 mg via ORAL
  Filled 2021-03-18: qty 2

## 2021-03-18 MED ORDER — ASPIRIN 81 MG PO TBEC: 81.0000 mg | DELAYED_RELEASE_TABLET | Freq: Every day | ORAL | 11 refills | Status: AC

## 2021-03-18 NOTE — Progress Notes (Signed)
RN gave pt discharge instructions and she stated understanding, IV has been removed and pt stated that she called her sister, RN also attempted to call pts sister to notify her of DC no answer I left a message.

## 2021-03-18 NOTE — Progress Notes (Signed)
RN called Pharmacy and asked if they could have a Pharmacy tech come up and verify pts home medications per MD. Pharmacy tech should be up here shortly.

## 2021-03-18 NOTE — Progress Notes (Signed)
SLP Cancellation Note  Patient Details Name: Denise Harding MRN: [id number] DOB: June 18, 1951   Cancelled treatment:       Reason Eval/Treat Not Completed: SLP screened, no needs identified, will sign off; pt screened for initial aphasic symptoms with resolution of symptoms as pt able to communicate within complex conversation without deficits noted.  ST will s/o at this time.   Tressie Stalker, M.S., CCC-SLP 03/18/2021, 12:40 PM

## 2021-03-18 NOTE — Progress Notes (Signed)
Pt refused her AM labs and Phlebotomy went back and tried to talk to the patient and she stated that she has a phobia of needles and that if they can not pull it off her peripheral IV like the ED told her she would leave. Will notify MD and Follow up.

## 2021-03-18 NOTE — Progress Notes (Signed)
Pt AOX4 vital signs taken upon admission with no complaints. Pt now yelling screaming when vital are taken stating " take it off its too tight different site on leg was tried but still started yelling "Take it off." Pt also had stated has extreme fear of needles and will not be having blood work done. MD informed

## 2021-03-18 NOTE — Progress Notes (Signed)
Pt's sister called and stated that she is the patients POA and wanted to get updates on her sister and has some concerns she wants to express Her name is Carvel Getting [phone]. She also stated that she lives alone at home and is not able to care for herself properly and that the patient called her sister and said she was leaving and needs to be picked up, told her sister I have not seen anything about a DC would have MD reach out to her. MD notified via secure chat

## 2021-03-18 NOTE — Discharge Instructions (Signed)
Transient Ischemic Attack A transient ischemic attack (TIA) causes stroke-like symptoms that go away quickly. Having a TIA means that a person is at higher risk for a stroke. A TIA happens when blood supply to the brain is blocked temporarily. A TIA is a medical emergency. What are the causes? This condition is caused by a temporary blockage in an artery in the head or neck. This means the brain does not get the blood supply it needs. There is no permanent brain damage with a TIA. A blockage can be caused by:  Fatty buildup in an artery in the head or neck (atherosclerosis).  A blood clot.  An artery tear (dissection).  Inflammation of an artery (vasculitis). Sometimes the cause is not known. What increases the risk? Certain factors may make you more likely to develop this condition. Some of these are things that you can change, such as:  Obesity.  Using products that contain nicotine or tobacco.  Taking oral birth control, especially if you also use tobacco.  Not being active.  Heavy alcohol use.  Drug use, especially cocaine and methamphetamine. Medical conditions that may increase your risk include:  High blood pressure (hypertension).  High cholesterol.  Diabetes.  Heart disease (coronary artery disease).  An irregular heartbeat, also called atrial fibrillation (AFib).  Sickle cell disease.  Sleep problems (sleep apnea).  Chronic inflammatory diseases, such as rheumatoid arthritis or lupus.  Blood clotting disorders (hypercoagulable state). Other risk factors include:  Being over the age of 60.  Being female.  Family history of stroke.  Previous history of blood clots, stroke, TIA, or heart attack.  Having a history of preeclampsia.  Migraine headache. What are the signs or symptoms? Symptoms of a TIA are the same as those of a stroke. The symptoms develop suddenly, and then go away quickly. They may include:  Weakness or numbness in your face, arm, or  leg, especially on one side of your body.  Trouble walking or moving your arms or legs.  Trouble speaking, understanding speech, or both (aphasia).  Vision changes, such as double vision, blurred vision, or loss of vision.  Dizziness.  Confusion.  Loss of balance or coordination.  Nausea and vomiting.  Severe headache. If possible, note what time your symptoms started. Tell your health care provider.   How is this diagnosed? This condition may be diagnosed based on:  Your symptoms and medical history.  A physical exam.  Imaging tests, usually a CT scan or MRI of the brain.  Blood tests. You may also have other tests, including:  Electrocardiogram (ECG).  Echocardiogram.  Carotid ultrasound.  A scan of blood circulation in the brain (CT angiogram or MR angiogram).  Continuous heart monitoring. How is this treated? The goal of treatment is to reduce the risk for a stroke. Stroke prevention therapies may include:  Changes to diet and lifestyle, such as being physically active and stopping smoking.  Medicines to thin the blood (antiplatelets or anticoagulants).  Blood pressure medicines.  Medicines to reduce cholesterol.  Treating other health conditions, such as diabetes or AFib. If testing shows a narrowing in the arteries to your brain, your health care provider may recommend a procedure, such as:  Carotid endarterectomy. This is done to remove the blockage from your artery.  Carotid angioplasty and stenting. This uses a tube (stent) to open or widen an artery in the neck. The stent helps keep the artery open by supporting the artery walls. Follow these instructions at home: Medicines    Take over-the-counter and prescription medicines only as told by your health care provider.  If you were told to take a medicine to thin your blood, such as aspirin or an anticoagulant, take it exactly as told by your health care provider. ? Taking too much blood-thinning  medicine can cause bleeding. Taking too little will not protect you against a stroke and other problems. Eating and drinking  Eat 5 or more servings of fruits and vegetables each day.  Follow guidelines from your health care provider about your diet. You may need to follow a certain diet to help manage risk factors for stroke. This may include: ? Eating a low-fat, low-salt diet. ? Choosing high-fiber foods. ? Limiting carbohydrates and sugar.  If you drink alcohol: ? Limit how much you have to:  0-1 drink a day for women who are not pregnant.  0-2 drinks a day for men. ? Know how much alcohol is in a drink. In the [place] one drink equals one 12 oz bottle of beer (355mL), one 5 oz glass of wine (148mL), or one 1 oz glass of hard liquor (44mL).   General instructions  Maintain a healthy weight.  Try to get at least 30 minutes of exercise on most days.  Get treatment if you have sleep apnea.  Do not use any products that contain nicotine or tobacco. These products include cigarettes, chewing tobacco, and vaping devices, such as e-cigarettes. If you need help quitting, ask your health care provider.  Do not use drugs.  Keep all follow-up visits. This is important. Where to find more information  American Stroke Association: www.stroke.org Get help right away if:  You have chest pain or an irregular heartbeat.  You have any symptoms of a stroke. "BE FAST" is an easy way to remember the main warning signs of a stroke. ? B - Balance. Signs are dizziness, sudden trouble walking, or loss of balance. ? E - Eyes. Signs are trouble seeing or a sudden change in vision. ? F - Face. Signs are sudden weakness or numbness of the face, or the face or eyelid drooping on one side. ? A - Arms. Signs are weakness or numbness in an arm. This happens suddenly and usually on one side of the body. ? S - Speech. Signs are sudden trouble speaking, slurred speech, or trouble understanding what people  say. ? T - Time. Time to call emergency services. Write down what time symptoms started.  You have other signs of a stroke, such as: ? A sudden, severe headache with no known cause. ? Nausea or vomiting. ? Seizure. These symptoms may represent a serious problem that is an emergency. Do not wait to see if the symptoms will go away. Get medical help right away. Call your local emergency services (911 in the [place] Do not drive yourself to the hospital. Summary  A transient ischemic attack (TIA) happens when an artery in the head or neck is blocked. The blockage clears before there is any permanent brain damage. A TIA is a medical emergency.  Symptoms of a TIA are the same as those of a stroke. The symptoms develop suddenly, and then go away quickly.  Having a TIA means that you are at higher risk for a stroke.  The goal of treatment is to reduce your risk for a stroke. Treatment may include medicines to thin the blood and changes to diet and lifestyle. This information is not intended to replace advice given to you by your health   care provider. Make sure you discuss any questions you have with your health care provider. Document Revised: 06/05/2020 Document Reviewed: 06/05/2020 [employer] Patient Education  2021 [employer]  

## 2021-03-18 NOTE — Procedures (Addendum)
Patient Name: Denise Harding  MRN: [id number]  Epilepsy Attending: Charlsie Quest  Referring Physician/Provider: Dr Caryl Pina Date: 03/18/2021 Duration: 22.03 mins  Patient history: 70 year old female presenting after a spell of transient aphasia. EEG to evaluate for seizure  Level of alertness: Awake  AEDs during EEG study: None  Technical aspects: This EEG study was done with scalp electrodes positioned according to the 10-20 International system of electrode placement. Electrical activity was acquired at a sampling rate of 500Hz  and reviewed with a high frequency filter of 70Hz  and a low frequency filter of 1Hz . EEG data were recorded continuously and digitally stored.   Description: The posterior dominant rhythm consists of 9 Hz activity of moderate voltage (25-35 uV) seen predominantly in posterior head regions, symmetric and reactive to eye opening and eye closing. Physiologic photic driving was seen during photic stimulation.  Hyperventilation was not performed.     IMPRESSION: This study is within normal limits. No seizures or epileptiform discharges were seen throughout the recording.  Kymoni Monday 

## 2021-03-18 NOTE — Progress Notes (Addendum)
STROKE TEAM PROGRESS NOTE   INTERVAL HISTORY Patient is resting in bed. Patient reports that she recalls walking her dog with some friends yesterday when suddenly she was speaking to them and they could not understand what she was saying. Patient reports her speech was "garbled" for about 10 min. Patient reports she could understand what her friends were saying. Patient reports that she has been taking Plavix since her prior stroke in 2018 as prescribed as well as her cholesterol and antihypertensive. 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 Patient also reports that she has a fear of needles and refused any more blood draws.  Vitals:   03/17/21 1900 03/17/21 2026 03/17/21 2100 03/18/21 1001  BP: 123/75 (!) 141/74 134/78 (!) 147/77  Pulse: 87 82 75 89  Resp: 16 17 18    Temp:  98.2 F (36.8 C) (!) 97 F (36.1 C)   TempSrc:  Oral Oral   SpO2: 100% 100% 97%   Weight:      Height:       CBC:  Recent Labs  Lab 03/17/21 1450  WBC 9.0  NEUTROABS 7.2  HGB 14.8  HCT 42.3  MCV 89.6  PLT 336   Basic Metabolic Panel:  Recent Labs  Lab 03/17/21 1450  NA 138  K 4.0  CL 106  CO2 19*  GLUCOSE 108*  BUN 18  CREATININE 1.18*  CALCIUM 9.4    IMAGING past 24 hours CT HEAD WO CONTRAST  Result Date: 03/17/2021 CLINICAL DATA:  Concern for transient ischemic attack, difficulty talking earlier today EXAM: CT HEAD WITHOUT CONTRAST TECHNIQUE: Contiguous axial images were obtained from the base of the skull through the vertex without intravenous contrast. COMPARISON:  Head CT July 18, 2019 FINDINGS: Brain: No evidence of acute large vascular territory infarction, hemorrhage, hydrocephalus, extra-axial collection or mass lesion/mass effect. Stable age related global parenchymal volume loss. Unchanged slight enlargement of the left lateral ventricle in comparison to the right. Prior infarcts involving the anterior limb  of the left internal and external capsules extending into the adjacent left superior lentiform nucleus and centrum semiovale. 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 Similar mild burden of chronic microvascular ischemic white matter disease. Vascular: No hyperdense vessel. Atherosclerotic calcifications of the internal carotid arteries at the skull base. Skull: Normal. Negative for fracture or focal lesion. Sinuses/Orbits: The paranasal sinuses and mastoid air cells are predominantly clear. Orbits are grossly unremarkable. Other: None IMPRESSION: 1. No acute intracranial abnormality. 2. 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 Electronically Signed   By: Maudry MayhewJeffrey  Waltz MD   On: 03/17/2021 15:14   MR BRAIN WO CONTRAST  Result Date: 03/18/2021 CLINICAL DATA:  TIA EXAM: MRI HEAD WITHOUT CONTRAST TECHNIQUE: Multiplanar, multiecho pulse sequences of the brain and surrounding structures were obtained without intravenous contrast. COMPARISON:  05/13/2017 FINDINGS: Brain: No acute infarct, mass effect or extra-axial collection. No acute or chronic hemorrhage. There is multifocal hyperintense T2-weighted signal within the white matter. Generalized volume loss without a clear lobar predilection. 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 The midline structures are normal. Vascular: Major flow voids are preserved. Skull and upper cervical spine: Normal calvarium and skull base. Visualized upper cervical spine and soft tissues are normal. Sinuses/Orbits:No paranasal sinus fluid levels or advanced  mucosal thickening. No mastoid or middle ear effusion. Normal orbits. IMPRESSION: 1. No acute intracranial abnormality. 2. 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 Electronically Signed   By: Deatra Robinson M.D.   On: 03/18/2021 03:09   EEG  adult  Result Date: 03/18/2021 Charlsie Quest, MD     03/18/2021 10:07 AM Patient Name: Denise Harding MRN: [id number] Epilepsy Attending: Charlsie Quest Referring Physician/Provider: Dr Caryl Pina Date: 03/18/2021 Duration: 22.03 mins Patient history: 70 year old female presenting after a spell of transient aphasia. EEG to evaluate for seizure Level of alertness: Awake AEDs during EEG study: None Technical aspects: This EEG study was done with scalp electrodes positioned according to the 10-20 International system of electrode placement. Electrical activity was acquired at a sampling rate of 500Hz  and reviewed with a high frequency filter of 70Hz  and a low frequency filter of 1Hz . EEG data were recorded continuously and digitally stored. Description: The posterior dominant rhythm consists of 9 Hz activity of moderate voltage (25-35 uV) seen predominantly in posterior head regions, symmetric and reactive to eye opening and eye closing. Physiologic photic driving was seen during photic stimulation.  Hyperventilation was not performed.   IMPRESSION: This study is within normal limits. No seizures or epileptiform discharges were seen throughout the recording. Priyanka   CT ANGIO HEAD CODE STROKE  Result Date: 03/18/2021 CLINICAL DATA:  Stroke follow-up. EXAM: CT HEAD WITHOUT CONTRAST CT ANGIOGRAPHY OF THE HEAD AND NECK TECHNIQUE: Contiguous axial images were obtained from the base of the skull through the vertex without intravenous contrast. Multidetector CT imaging of the head and neck was performed using the standard protocol during bolus administration of intravenous contrast. Multiplanar CT image reconstructions and MIPs were obtained to evaluate the vascular anatomy. Carotid stenosis measurements (when applicable) are obtained utilizing NASCET criteria, using the distal internal carotid diameter as the denominator. CONTRAST:  72mL OMNIPAQUE IOHEXOL 350 MG/ML SOLN COMPARISON:  CT head March 17, 2021. Same day MRI. CTA May 12, 2017. FINDINGS: CT HEAD Brain: No evidence of acute large vascular territory infarction, hemorrhage, hydrocephalus, extra-axial collection or mass lesion/mass effect. Similar remote infarcts in the left basal ganglia and left parietal lobe. Similar patchy white matter hypoattenuation, likely related to chronic microvascular ischemic disease. Vascular: See below. Skull: No acute fracture. Sinuses: Visualized sinuses are clear. Other: No mastoid effusions. CTA NECK Aortic arch: Great vessel origins are patent. Calcific atherosclerosis. Right carotid system: No significant stenosis. Mild calcific atherosclerosis at the carotid bifurcation. Left carotid system: Mild atherosclerosis at the common carotid origin and the carotid bifurcation without significant stenosis. Vertebral arteries:Mildly left dominant.  No significant stenosis. Skeleton: Mild multilevel degenerative change of the cervical spine. Other neck: No acute abnormality. Subcentimeter right thyroid nodule which does not require further imaging follow-up per current guidelines. Visualized lung apices are clear. CTA HEAD Anterior circulation: Similar mild calcific atherosclerosis of bilateral cavernous and paraclinoid ICAs. Similar moderate short-segment stenosis of the proximal right M1 MCA. Similar severe left distal M1 MCA stenosis with decreased caliber of left M2 MCA branches likely related to flow limitation. Bilateral ACAs are patent. Posterior circulation: Small right intradural vertebral artery, similar to prior. No large vessel occlusion or proximal hemodynamically significant stenosis. Bilateral posterior communicating arteries. Venous sinuses: No evidence of dural sinus thrombosis. IMPRESSION: 1. 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 2. Similar moderate proximal right M1 MCA stenosis. 3.  No hemodynamically  significant stenosis in the neck. Electronically Signed   By: Feliberto Harts MD   On: 03/18/2021 10:12   CT ANGIO NECK CODE STROKE  Result Date: 03/18/2021 CLINICAL DATA:  Stroke follow-up. EXAM: CT HEAD WITHOUT CONTRAST CT ANGIOGRAPHY OF THE HEAD AND NECK TECHNIQUE: Contiguous axial images were obtained from the base of the skull through the vertex without intravenous contrast. Multidetector CT imaging of the head and neck was performed using the standard protocol during bolus administration of intravenous contrast. Multiplanar CT image reconstructions and MIPs were obtained to evaluate the vascular anatomy. Carotid stenosis measurements (when applicable) are obtained utilizing NASCET criteria, using the distal internal carotid diameter as the denominator. CONTRAST:  75mL OMNIPAQUE IOHEXOL 350 MG/ML SOLN COMPARISON:  CT head March 17, 2021. Same day MRI. CTA May 12, 2017. FINDINGS: CT HEAD Brain: No evidence of acute large vascular territory infarction, hemorrhage, hydrocephalus, extra-axial collection or mass lesion/mass effect. Similar remote infarcts in the left basal ganglia and left parietal lobe. Similar patchy white matter hypoattenuation, likely related to chronic microvascular ischemic disease. Vascular: See below. Skull: No acute fracture. Sinuses: Visualized sinuses are clear. Other: No mastoid effusions. CTA NECK Aortic arch: Great vessel origins are patent. Calcific atherosclerosis. Right carotid system: No significant stenosis. Mild calcific atherosclerosis at the carotid bifurcation. Left carotid system: Mild atherosclerosis at the common carotid origin and the carotid bifurcation without significant stenosis. Vertebral arteries:Mildly left dominant.  No significant stenosis. Skeleton: Mild multilevel degenerative change of the cervical spine. Other neck: No acute abnormality. Subcentimeter right thyroid nodule which does not require further imaging follow-up per current guidelines.  Visualized lung apices are clear. CTA HEAD Anterior circulation: Similar mild calcific atherosclerosis of bilateral cavernous and paraclinoid ICAs. Similar moderate short-segment stenosis of the proximal right M1 MCA. Similar severe left distal M1 MCA stenosis with decreased caliber of left M2 MCA branches likely related to flow limitation. Bilateral ACAs are patent. Posterior circulation: Small right intradural vertebral artery, similar to prior. No large vessel occlusion or proximal hemodynamically significant stenosis. Bilateral posterior communicating arteries. Venous sinuses: No evidence of dural sinus thrombosis. IMPRESSION: 1. 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 2. Similar moderate proximal right M1 MCA stenosis. 3. No hemodynamically significant stenosis in the neck. Electronically Signed   By: Feliberto Harts MD   On: 03/18/2021 10:12    PHYSICAL EXAM Pleasant frail elderly Caucasian lady not in distress. HEENT-  Normal appearance no ecchymosis. Lungs- Respirations unlabored Extremities- No edema  Neurological Examination Mental Status: Alert, oriented to person, place, time, and situation, thought content appropriate.  Speech fluent with intact comprehension, repetition, and naming. No dysarthria. Repetition intact.  Cranial Nerves: II: Visual fields intact with no extinction to DSS. PERRL. 3mm bil brisk.   III,IV, VI: No ptosis. EOMI. No nystagmus.   V,VII: Smile symmetric, facial temp sensation equal bilaterally VIII: Hearing intact to voice, reports being hard of hearing at baseline but appears to have no significant problems hearing commands or questions IX,X: No hypophonia, symmetrical palate elevation XI: Symmetric XII: Midline tongue extension Motor: Right :  Upper extremity   5/5                                      Left:     Upper extremity   5/5  Lower  extremity   5/5                                                  Lower extremity   5/5 No pronator drift Sensory: Temp and light touch intact throughout, bilaterally Cerebellar: No ataxia with FTN bilaterally  Gait: Deferred     ASSESSMENT/PLAN Denise Harding is a 70 y.o. female with history of HLD, HTN, CKD 3 and prior stroke in 2018 who presented to the [facility] ED on Sunday afternoon after she experienced expressive aphasia lasting for about 10 minutes. Patient CT and MRI were negative. CT A Head and neck noted some stenosis of the L M1 MCA with some diminished flow  In the distal MCA sirculation. There was also moderate M1 MCA stenosis. Otherwise patient neurologically is at her baseline. Patient continues to refuse blood draws. Explained to patient that it is difficutl to adjust medications without the actual lipid level and cannot assess accurately for T2DM. Patient reported understanding and was willing to take increased dose of statin as patient TIA suggest that she likely needs an increased dose. Recommend her OP PCP attempt to get levels in the future w/ goal LDL <70.    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 Small vessel disease.    CTA head & neck: IMPRESSION: 1. 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 2. Similar moderate proximal right M1 MCA stenosis. 3. No hemodynamically significant stenosis in the neck.  MRI   IMPRESSION: 1. No acute intracranial abnormality. 2. 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  2D Echo pending  VAS US Carotid  Recommend 30 day monitor with Cardiology  LDL , patient refusing blood draw  HgbA1c patient refusing blood draw. However CBG from initial CMP is not concerning hyperglycemia. Patient has no  hx of DM or uncontrolled hyperglycemia.   VTE prophylaxis - SCDs    Diet   Diet regular Room service appropriate? Yes; Fluid consistency: Thin     clopidogrel 75 mg daily prior to admission, now on aspirin 81 mg daily and Brilinta (ticagrelor) 90 mg bid.   ASA+ Brilinta 180mg  today transition to ASA 81mg  daily and Brilinta 90mg  BID for 3 months then ASA alone.  Therapy recommendations:  PT/OT, passed swallow study  Disposition:  Pending  Hypertension  Home meds:  Amlodipine 10mg  and benazepril 20mg   . Continue home medications . Long-term BP goal normotensive  Hyperlipidemia  Home meds:  Atorvastatin 20mg ,   LDL patient refused labs  Increased to 40mg  Atorvastatin  Continue statin at discharge  Patient has myalgias as a "allery reaction" to atorvastatin in her chart, but patient reported taking it. Patient was also recorded taking per EMR record.  Other Stroke Risk Factors  Advanced Age >/= 71    Hospital day # 0  , MD PGY-1 I have personally obtained history,examined this patient, reviewed notes, independently viewed imaging studies, participated in medical decision making and plan of care.ROS completed by me personally and pertinent positives fully documented  I have made any additions or clarifications directly to the above note. Agree with note above.  Patient presented with transient expressive aphasia due  to left hemispheric TIA and CT angiogram shows diffuse bilateral intracranial atherosclerosis with severe stenosis of left distal M1 still symptomatic.  Since patient was already on Plavix prior to admission recommend switching to aspirin and Brilinta for 3 months followed by aspirin alone and aggressive risk factor modification with strict control of hypertension with blood pressure goal below 130/90 lipids with LDL goal below 70 mg percent.  Since patient is refusing blood draw for checking lipid profile I would recommend increasing the dose of Lipitor  from 20 to 40 mg daily.  Patient counseled to be compliant with medications and aggressive risk factor modification.  She will return for follow-up in the future in stroke clinic in 6 weeks or call earlier if necessary.  Discussed with Dr. Benjamine Mola.  Greater than 50% time during this 35-minute visit was spent in counseling and coordination of care about her TIA intracranial stenosis and discussion about stroke prevention and treatment Delia Heady, MD Medical Director [facility] Pager: [phone] 03/18/2021 2:50 PM  To contact Stroke Continuity provider, please refer to [email or web address]. After hours, contact General Neurology

## 2021-03-18 NOTE — Progress Notes (Signed)
Routine EEG complete. Pending results 

## 2021-03-18 NOTE — Discharge Summary (Addendum)
Physician Discharge Summary  Denise Harding QIO:[id number] DOB: 1951/01/28 DOA: 03/17/2021  PCP: Bailey MechPodraza, Cole Christopher, PA-C  Admit date: 03/17/2021 Discharge date: 03/18/2021  Admitted From: home Discharge disposition: home   Recommendations for Outpatient Follow-Up:   1. Follow up with neurology re: TIA 2. Also will need formal memory evaluation as family expresses concerns about her ability to care for herself 3. ASA plus brilinta for 3 months then ASA alone   Discharge Diagnosis:   Principal Problem:   TIA (transient ischemic attack) Active Problems:   Essential hypertension   Hyperlipidemia   CKD (chronic kidney disease) stage 3, GFR 30-59 ml/min (HCC)    Discharge Condition: Improved.  Diet recommendation: Low sodium, heart healthy  Wound care: None.  Code status: Full.   History of Present Illness:   Denise Harding is a 70 y.o. female with medical history significant for HTN, HLD, CKD 3, hx of CVA in 2018 who presents with episode of difficulty speaking today. She was walking her dog with another couple this afternoon when she had a sudden onset of inability to speak.  She states that she tried to talk to her neighbor and answer a question but she could only make nonsensical gibberish sounds.  Symptoms lasted for 10 minutes and then completely resolved.  She is now at her baseline and can talk normally.  She did not have any tremors or jerking of her extremities.  She did not have any slurred speech, drooping face, drooping eyelid or numbness or weakness of extremities.  She had no loss of consciousness or loss of bowel or bladder control.  She had a small stroke in 2018 that has no residual deficit she states.  She has been compliant with her medications of Lotrel, Lipitor and Plavix.  She has not had any recent fever or illness.    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  Since patient was already on Plavix prior to admission recommend switching to aspirin and Brilinta for 3 months followed by aspirin alone and aggressive risk factor modification with strict control of hypertension with blood pressure goal below 130/90 lipids with LDL goal below 70 mg percent.  Since patient is refusing blood draw for checking lipid profile would recommend increasing the dose of Lipitor from 20 to 40 mg daily.    Medical Consultants:    neurology  Discharge Exam:   Vitals:   03/18/21 1001 03/18/21 1226  BP: (!) 147/77 138/68  Pulse: 89 94  Resp:  16  Temp:  98.5 F (36.9 C)  SpO2:  100%   Vitals:   03/17/21 2026 03/17/21 2100 03/18/21 1001 03/18/21 1226  BP: (!) 141/74 134/78 (!) 147/77 138/68  Pulse: 82 75 89 94  Resp: 17 18  16   Temp: 98.2 F (36.8 C) (!) 97 F (36.1 C)  98.5 F (36.9 C)  TempSrc: Oral Oral  Oral  SpO2: 100% 97%  100%  Weight:      Height:        General exam: Appears calm and comfortable.    The results of significant diagnostics from this hospitalization (including imaging, microbiology, ancillary and laboratory) are listed below for reference.     Procedures and Diagnostic Studies:   CT HEAD WO CONTRAST  Result Date: 03/17/2021 CLINICAL DATA:  Concern for transient ischemic  attack, difficulty talking earlier today EXAM: CT HEAD WITHOUT CONTRAST TECHNIQUE: Contiguous axial images were obtained from the base of the skull through the vertex without intravenous contrast. COMPARISON:  Head CT July 18, 2019 FINDINGS: Brain: No evidence of acute large vascular territory infarction, hemorrhage, hydrocephalus, extra-axial collection or mass lesion/mass effect. Stable age related global parenchymal volume loss. Unchanged slight enlargement of the left lateral ventricle in comparison to the right. Prior infarcts involving  the anterior limb of the left internal and external capsules extending into the adjacent left superior lentiform nucleus and centrum semiovale. 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 Similar mild burden of chronic microvascular ischemic white matter disease. Vascular: No hyperdense vessel. Atherosclerotic calcifications of the internal carotid arteries at the skull base. Skull: Normal. Negative for fracture or focal lesion. Sinuses/Orbits: The paranasal sinuses and mastoid air cells are predominantly clear. Orbits are grossly unremarkable. Other: None IMPRESSION: 1. No acute intracranial abnormality. 2. 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 Electronically Signed   By: Maudry Mayhew MD   On: 03/17/2021 15:14   MR BRAIN WO CONTRAST  Result Date: 03/18/2021 CLINICAL DATA:  TIA EXAM: MRI HEAD WITHOUT CONTRAST TECHNIQUE: Multiplanar, multiecho pulse sequences of the brain and surrounding structures were obtained without intravenous contrast. COMPARISON:  05/13/2017 FINDINGS: Brain: No acute infarct, mass effect or extra-axial collection. No acute or chronic hemorrhage. There is multifocal hyperintense T2-weighted signal within the white matter. Generalized volume loss without a clear lobar predilection. 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 The midline structures are normal. Vascular: Major flow voids are preserved. Skull and upper cervical spine: Normal calvarium and skull base. Visualized upper cervical spine and soft tissues are normal. Sinuses/Orbits:No paranasal sinus fluid levels or advanced mucosal thickening. No mastoid or middle ear effusion. Normal orbits. IMPRESSION: 1. No acute intracranial abnormality. 2. 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 Electronically Signed   By: Deatra Robinson M.D.   On:  03/18/2021 03:09   EEG adult  Result Date: 03/18/2021 Charlsie Quest, MD     03/18/2021 10:07 AM Patient Name: Denise Harding MRN: [id number] Epilepsy Attending: Charlsie Quest Referring Physician/Provider: Dr Caryl Pina Date: 03/18/2021 Duration: 22.03 mins Patient history: 70 year old female presenting after a spell of transient aphasia. EEG to evaluate for seizure Level of alertness: Awake AEDs during EEG study: None Technical aspects: This EEG study was done with scalp electrodes positioned according to the 10-20 International system of electrode placement. Electrical activity was acquired at a sampling rate of  and reviewed with a high frequency filter of  and a low frequency filter of . EEG data were recorded continuously and digitally stored. Description: The posterior dominant rhythm consists of 9 Hz activity of moderate voltage (25-35 uV) seen predominantly in posterior head regions, symmetric and reactive to eye opening and eye closing. Physiologic photic driving was seen during photic stimulation.  Hyperventilation was not performed.   IMPRESSION: This study is within normal limits. No seizures or epileptiform discharges were seen throughout the recording. Priyanka Annabelle Harman   CT ANGIO HEAD CODE STROKE  Result Date: 03/18/2021 CLINICAL DATA:  Stroke follow-up. EXAM: CT HEAD WITHOUT CONTRAST CT ANGIOGRAPHY OF THE HEAD AND NECK TECHNIQUE: Contiguous axial images were obtained from the base of the skull through the vertex without intravenous contrast. Multidetector CT imaging of the head  and neck was performed using the standard protocol during bolus administration of intravenous contrast. Multiplanar CT image reconstructions and MIPs were obtained to evaluate the vascular anatomy. Carotid stenosis measurements (when applicable) are obtained utilizing NASCET criteria, using the distal internal carotid diameter as the denominator. CONTRAST:  65mL OMNIPAQUE IOHEXOL 350 MG/ML SOLN  COMPARISON:  CT head March 17, 2021. Same day MRI. CTA May 12, 2017. FINDINGS: CT HEAD Brain: No evidence of acute large vascular territory infarction, hemorrhage, hydrocephalus, extra-axial collection or mass lesion/mass effect. Similar remote infarcts in the left basal ganglia and left parietal lobe. Similar patchy white matter hypoattenuation, likely related to chronic microvascular ischemic disease. Vascular: See below. Skull: No acute fracture. Sinuses: Visualized sinuses are clear. Other: No mastoid effusions. CTA NECK Aortic arch: Great vessel origins are patent. Calcific atherosclerosis. Right carotid system: No significant stenosis. Mild calcific atherosclerosis at the carotid bifurcation. Left carotid system: Mild atherosclerosis at the common carotid origin and the carotid bifurcation without significant stenosis. Vertebral arteries:Mildly left dominant.  No significant stenosis. Skeleton: Mild multilevel degenerative change of the cervical spine. Other neck: No acute abnormality. Subcentimeter right thyroid nodule which does not require further imaging follow-up per current guidelines. Visualized lung apices are clear. CTA HEAD Anterior circulation: Similar mild calcific atherosclerosis of bilateral cavernous and paraclinoid ICAs. Similar moderate short-segment stenosis of the proximal right M1 MCA. Similar severe left distal M1 MCA stenosis with decreased caliber of left M2 MCA branches likely related to flow limitation. Bilateral ACAs are patent. Posterior circulation: Small right intradural vertebral artery, similar to prior. No large vessel occlusion or proximal hemodynamically significant stenosis. Bilateral posterior communicating arteries. Venous sinuses: No evidence of dural sinus thrombosis. IMPRESSION: 1. 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 2. Similar moderate proximal right M1 MCA  stenosis. 3. No hemodynamically significant stenosis in the neck. Electronically Signed   By: Feliberto Harts MD   On: 03/18/2021 10:12   CT ANGIO NECK CODE STROKE  Result Date: 03/18/2021 CLINICAL DATA:  Stroke follow-up. EXAM: CT HEAD WITHOUT CONTRAST CT ANGIOGRAPHY OF THE HEAD AND NECK TECHNIQUE: Contiguous axial images were obtained from the base of the skull through the vertex without intravenous contrast. Multidetector CT imaging of the head and neck was performed using the standard protocol during bolus administration of intravenous contrast. Multiplanar CT image reconstructions and MIPs were obtained to evaluate the vascular anatomy. Carotid stenosis measurements (when applicable) are obtained utilizing NASCET criteria, using the distal internal carotid diameter as the denominator. CONTRAST:  42mL OMNIPAQUE IOHEXOL 350 MG/ML SOLN COMPARISON:  CT head March 17, 2021. Same day MRI. CTA May 12, 2017. FINDINGS: CT HEAD Brain: No evidence of acute large vascular territory infarction, hemorrhage, hydrocephalus, extra-axial collection or mass lesion/mass effect. Similar remote infarcts in the left basal ganglia and left parietal lobe. Similar patchy white matter hypoattenuation, likely related to chronic microvascular ischemic disease. Vascular: See below. Skull: No acute fracture. Sinuses: Visualized sinuses are clear. Other: No mastoid effusions. CTA NECK Aortic arch: Great vessel origins are patent. Calcific atherosclerosis. Right carotid system: No significant stenosis. Mild calcific atherosclerosis at the carotid bifurcation. Left carotid system: Mild atherosclerosis at the common carotid origin and the carotid bifurcation without significant stenosis. Vertebral arteries:Mildly left dominant.  No significant stenosis. Skeleton: Mild multilevel degenerative change of the cervical spine. Other neck: No acute abnormality. Subcentimeter right thyroid nodule which does not require further imaging follow-up  per  current guidelines. Visualized lung apices are clear. CTA HEAD Anterior circulation: Similar mild calcific atherosclerosis of bilateral cavernous and paraclinoid ICAs. Similar moderate short-segment stenosis of the proximal right M1 MCA. Similar severe left distal M1 MCA stenosis with decreased caliber of left M2 MCA branches likely related to flow limitation. Bilateral ACAs are patent. Posterior circulation: Small right intradural vertebral artery, similar to prior. No large vessel occlusion or proximal hemodynamically significant stenosis. Bilateral posterior communicating arteries. Venous sinuses: No evidence of dural sinus thrombosis. IMPRESSION: 1. 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 2. Similar moderate proximal right M1 MCA stenosis. 3. No hemodynamically significant stenosis in the neck. Electronically Signed   By: Feliberto Harts MD   On: 03/18/2021 10:12     Labs:   Basic Metabolic Panel: Recent Labs  Lab 03/17/21 1450  NA 138  K 4.0  CL 106  CO2 19*  GLUCOSE 108*  BUN 18  CREATININE 1.18*  CALCIUM 9.4   GFR Estimated Creatinine Clearance: 39.4 mL/min (A) (by C-G formula based on SCr of 1.18 mg/dL (H)). Liver Function Tests: Recent Labs  Lab 03/17/21 1450  AST 27  ALT 25  ALKPHOS 78  BILITOT 0.5  PROT 7.2  ALBUMIN 4.4   No results for input(s): LIPASE, AMYLASE in the last 168 hours. No results for input(s): AMMONIA in the last 168 hours. Coagulation profile No results for input(s): INR, PROTIME in the last 168 hours.  CBC: Recent Labs  Lab 03/17/21 1450  WBC 9.0  NEUTROABS 7.2  HGB 14.8  HCT 42.3  MCV 89.6  PLT 336   Cardiac Enzymes: No results for input(s): CKTOTAL, CKMB, CKMBINDEX, TROPONINI in the last 168 hours. BNP: Invalid input(s): POCBNP CBG: No results for input(s): GLUCAP in the last 168 hours. D-Dimer No results for input(s):  DDIMER in the last 72 hours. Hgb A1c No results for input(s): HGBA1C in the last 72 hours. Lipid Profile No results for input(s): CHOL, HDL, LDLCALC, TRIG, CHOLHDL, LDLDIRECT in the last 72 hours. Thyroid function studies No results for input(s): TSH, T4TOTAL, T3FREE, THYROIDAB in the last 72 hours.  Invalid input(s): FREET3 Anemia work up No results for input(s): VITAMINB12, FOLATE, FERRITIN, TIBC, IRON, RETICCTPCT in the last 72 hours. Microbiology Recent Results (from the past 240 hour(s))  Resp Panel by RT-PCR (Flu A&B, Covid) Nasopharyngeal Swab     Status: None   Collection Time: 03/17/21  2:50 PM   Specimen: Nasopharyngeal Swab; Nasopharyngeal(NP) swabs in vial transport medium  Result Value Ref Range Status   SARS Coronavirus 2 by RT PCR NEGATIVE NEGATIVE Final    Comment: (NOTE) SARS-CoV-2 target nucleic acids are NOT DETECTED.  The SARS-CoV-2 RNA is generally detectable in upper respiratory specimens during the acute phase of infection. The lowest concentration of SARS-CoV-2 viral copies this assay can detect is 138 copies/mL. A negative result does not preclude SARS-Cov-2 infection and should not be used as the sole basis for treatment or other patient management decisions. A negative result may occur with  improper specimen collection/handling, submission of specimen other than nasopharyngeal swab, presence of viral mutation(s) within the areas targeted by this assay, and inadequate number of viral copies(<138 copies/mL). A negative result must be combined with clinical observations, patient history, and epidemiological information. The expected result is Negative.  Fact Sheet for Patients:  BloggerCourse.com  Fact Sheet for Healthcare Providers:  SeriousBroker.it  This test is no t  yet approved or cleared by the [place] and  has been authorized for detection and/or diagnosis of SARS-CoV-2 by FDA under an  Emergency Use Authorization (EUA). This EUA will remain  in effect (meaning this test can be used) for the duration of the COVID-19 declaration under Section 564(b)(1) of the Act, 21 U.S.C.section 360bbb-3(b)(1), unless the authorization is terminated  or revoked sooner.       Influenza A by PCR NEGATIVE NEGATIVE Final   Influenza B by PCR NEGATIVE NEGATIVE Final    Comment: (NOTE) The Xpert Xpress SARS-CoV-2/FLU/RSV plus assay is intended as an aid in the diagnosis of influenza from Nasopharyngeal swab specimens and should not be used as a sole basis for treatment. Nasal washings and aspirates are unacceptable for Xpert Xpress SARS-CoV-2/FLU/RSV testing.  Fact Sheet for Patients: BloggerCourse.com  Fact Sheet for Healthcare Providers: SeriousBroker.it  This test is not yet approved or cleared by the [place] FDA and has been authorized for detection and/or diagnosis of SARS-CoV-2 by FDA under an Emergency Use Authorization (EUA). This EUA will remain in effect (meaning this test can be used) for the duration of the COVID-19 declaration under Section 564(b)(1) of the Act, 21 U.S.C. section 360bbb-3(b)(1), unless the authorization is terminated or revoked.  Performed at [facility], [address], [place], [place] [address]      Discharge Instructions:   Discharge Instructions    Ambulatory referral to Neurology   Complete by: As directed    An appointment is requested in approximately: formal memory evaluation as well as CVA follow up   Diet - low sodium heart healthy   Complete by: As directed    Discharge instructions   Complete by: As directed    ASA plus brilinta for 30 days then ASA alone Cardiology will call you with a 30 day event monitor   Increase activity slowly   Complete by: As directed      Allergies as of 03/18/2021      Reactions   Atorvastatin    Other reaction(s): Myalgias  (intolerance)      Medication List    TAKE these medications   amLODipine-benazepril 10-20 MG capsule Commonly known as: LOTREL Take by mouth.   aspirin 81 MG EC tablet Take 1 tablet (81 mg total) by mouth daily. Swallow whole. Start taking on: March 19, 2021   atorvastatin 40 MG tablet Commonly known as: LIPITOR Take 1 tablet (40 mg total) by mouth daily at 6 PM. What changed:   medication strength  how much to take   COLACE PO Take by mouth.   ticagrelor 90 MG Tabs tablet Commonly known as: BRILINTA Take 1 tablet (90 mg total) by mouth 2 (two) times daily.       Follow-up Information    Bailey Mech, PA-C Follow up in 1 week(s).   Specialty: Physician Assistant Contact information: [phone] [facility] [place] [address] [phone]        [facility] Follow up.   Why: office will call with your event monitor Contact information: [address] [place] [address] [phone]               Time coordinating discharge: 25 min  Signed:  Joseph Art DO  [facility] 03/18/2021, 12:29 PM

## 2021-03-18 NOTE — Progress Notes (Signed)
PT Cancellation Note  Patient Details Name: Denise Harding MRN: [id number] DOB: 09/10/1951   Cancelled Treatment:    Reason Eval/Treat Not Completed: PT screened, no needs identified, will sign off per OT, patient mobilizing at an independent level with no assistive device and is back to baseline. As such, no PT needs identified. Signing off. Thank you for the opportunity to participate in her care!!!    Lerry Liner PT, DPT, PN1   Supplemental Physical Therapist [facility]    Pager [phone] Acute Rehab Office [phone]

## 2021-03-18 NOTE — Progress Notes (Signed)
  Echocardiogram 2D Echocardiogram with definity and 3D has been performed.  Leta Jungling M 03/18/2021, 1:18 PM

## 2021-03-18 NOTE — Progress Notes (Signed)
ven

## 2021-03-18 NOTE — Evaluation (Signed)
Occupational Therapy Evaluation/Discharge Patient Details Name: Denise Harding MRN: [id number] DOB: 08/28/1951 Today's Date: 03/18/2021    History of Present Illness Denise Harding is a 70 y.o. female with medical history significant for HTN, HLD, CKD 3, hx of CVA in 2018 who presents with episode of difficulty speaking today. She was walking her dog with another couple this afternoon when she had a sudden onset of inability to speak. Symptoms lasted for 10 minutes and then completely resolved.   Clinical Impression   PTA, pt Independent in all ADLs, IADLs and mobility without AD. Pt walks her dog 4-5 times/day and active in daily life. Pt presents now at baseline, Independent in all ADLs and mobility assessed today without AD. No LOB or safety concerns. Pt's strength 5/5 and symmetrical, coordination and conversation WFL. Educated on alternative tub transfer methods to maximize safety but anticipate no difficulties. Pt anxious to go home today if able. No further skilled therapy services needed at acute level or on DC. OT to sign off.     Follow Up Recommendations  No OT follow up    Equipment Recommendations  None recommended by OT    Recommendations for Other Services       Precautions / Restrictions Precautions Precautions: None Restrictions Weight Bearing Restrictions: No      Mobility Bed Mobility Overal bed mobility: Independent                  Transfers Overall transfer level: Independent Equipment used: None                  Balance Overall balance assessment: No apparent balance deficits (not formally assessed)                                         ADL either performed or assessed with clinical judgement   ADL Overall ADL's : Independent                                       General ADL Comments: Able to don socks, mobilize to/from bathroom, toilet self and perform hand hygiene, as well as long distance  mobility without AD. educated on alternative tub transfers methods and encouraged sister be present initially to maximize safety     Vision Baseline Vision/History: Wears glasses Wears Glasses: Reading only Patient Visual Report: No change from baseline Vision Assessment?: No apparent visual deficits     Perception     Praxis      Pertinent Vitals/Pain Pain Assessment: No/denies pain     Hand Dominance Right   Extremity/Trunk Assessment Upper Extremity Assessment Upper Extremity Assessment: Overall WFL for tasks assessed   Lower Extremity Assessment Lower Extremity Assessment: Overall WFL for tasks assessed   Cervical / Trunk Assessment Cervical / Trunk Assessment: Normal   Communication Communication Communication: HOH   Cognition Arousal/Alertness: Awake/alert Behavior During Therapy: WFL for tasks assessed/performed Overall Cognitive Status: Within Functional Limits for tasks assessed                                 General Comments: Expressed frustration over events at hospital, specifically attempts for blood draw. But once shared concerns, pleasant and participatory   General Comments  Pt speech appears [facility], coordination  and balance WFL.    Exercises     Shoulder Instructions      Home Living Family/patient expects to be discharged to:: Private residence Living Arrangements: Alone Available Help at Discharge: Family;Neighbor;Friend(s);Available PRN/intermittently Type of Home: House Home Access: Level entry     Home Layout: One level     Bathroom Shower/Tub: Chief Strategy Officer: Standard     Home Equipment: None          Prior Functioning/Environment Level of Independence: Independent        Comments: Independent in all ADLs, iADLs and mobility. Walks dog 4-5 times a day, active        OT Problem List:        OT Treatment/Interventions:      OT Goals(Current goals can be found in the care plan section)  Acute Rehab OT Goals Patient Stated Goal: go home today OT Goal Formulation: All assessment and education complete, DC therapy  OT Frequency:     Barriers to D/C:            Co-evaluation              AM-PAC OT "6 Clicks" Daily Activity     Outcome Measure Help from another person eating meals?: None Help from another person taking care of personal grooming?: None Help from another person toileting, which includes using toliet, bedpan, or urinal?: None Help from another person bathing (including washing, rinsing, drying)?: None Help from another person to put on and taking off regular upper body clothing?: None Help from another person to put on and taking off regular lower body clothing?: None 6 Click Score: 24   End of Session Nurse Communication: Mobility status  Activity Tolerance: Patient tolerated treatment well Patient left: in bed;with call bell/phone within reach (sitting EOB ordering breakfast)  OT Visit Diagnosis: Other abnormalities of gait and mobility (R26.89)                Time: 2778-2423 OT Time Calculation (min): 15 min Charges:  OT General Charges $OT Visit: 1 Visit OT Evaluation $OT Eval Low Complexity: 1 Low  Bradd Canary, OTR/L Acute Rehab Services Office: [phone]  Lorre Munroe 03/18/2021, 7:44 AM

## 2021-03-18 NOTE — Progress Notes (Signed)
Lab tech informed pt refused lab work

## 2021-03-19 ENCOUNTER — Other Ambulatory Visit (HOSPITAL_COMMUNITY): Payer: Self-pay

## 2021-03-19 ENCOUNTER — Telehealth: Payer: Self-pay

## 2021-03-19 ENCOUNTER — Telehealth (HOSPITAL_BASED_OUTPATIENT_CLINIC_OR_DEPARTMENT_OTHER): Payer: Self-pay

## 2021-03-19 NOTE — Telephone Encounter (Signed)
Called pt to verify address and go over brief monitor instructions. 30 day [employer] monitor registered to be mailed to pt's home address.

## 2021-03-21 ENCOUNTER — Other Ambulatory Visit (HOSPITAL_COMMUNITY): Payer: Self-pay

## 2021-03-21 ENCOUNTER — Telehealth (HOSPITAL_COMMUNITY): Payer: Self-pay

## 2021-03-21 NOTE — Telephone Encounter (Signed)
Pharmacy Transitions of Care Follow-up Telephone Call  Date of discharge: 03/18/21 Discharge Diagnosis: TIA  How have you been since you were released from the hospital? Patient has been well but she cannot afford her Brilinta, Will have to contact Dr. Joellyn Quails to let him know that she cannot take it.  Medication changes made at discharge: START: aspirin, Brilinta CHANGE: Atorvastatin STOP: clopidogrel75 MG tablet(PLAVIX)  Medication changes verified by the patient? Yes    Medication Accessibility:  [facility]  Patient cannot afford Brilinta copay since  is over $400. I will contact the Dr. To make sure he is aware of this.    Medication Review:  TICAGRELOR (BRILINTA) Ticagrelor 90 mg BID initiated on  03/18/21 - patient will not be able to continue on this but has been on Plavix for years   Follow-up Appointments:  PCP Hospital f/u appt confirmed? Yes with Dr. Joellyn Quails on 06/07/21 in Internal Medicine.   If their condition worsens, is the pt aware to call PCP or go to the Emergency Dept.? yes  Final Patient Assessment: Patient doing well but cannot afford medication. I have spoken with [facility] and left message with Dr. Sharilyn Sites nurse that patient cannot afford her Brilinta and needs to be switched. Let patient know I have done this as well.

## 2021-03-22 ENCOUNTER — Ambulatory Visit (INDEPENDENT_AMBULATORY_CARE_PROVIDER_SITE_OTHER): Payer: Medicare Other

## 2021-03-22 DIAGNOSIS — I63512 Cerebral infarction due to unspecified occlusion or stenosis of left middle cerebral artery: Secondary | ICD-10-CM | POA: Diagnosis not present

## 2021-03-22 DIAGNOSIS — I4891 Unspecified atrial fibrillation: Secondary | ICD-10-CM

## 2021-04-10 ENCOUNTER — Telehealth: Payer: Self-pay | Admitting: Physician Assistant

## 2021-04-10 NOTE — Telephone Encounter (Signed)
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 She suggested to have patient see Cardiology- I offered new patient appointment with Dr.Lambert as this was ordered under him (possible hospital DOD during this time frame) When I contacted patient she stated that she does not want to follow with any office under [facility], she would like it sent to her PCP and have them follow. Patient was advised I would route to Neurologist who seen patient in hospital as well since patient had recent stroke. Patient verbalized understanding, she was made aware of how important it was to get a follow up appointment.  Report will be finished in 10 days, report should be sent to PCP requested by patient.

## 2021-04-10 NOTE — Telephone Encounter (Signed)
DJ W/ [employer] CALLING TO GIVE HEART MONITOR RESULTS.

## 2021-04-10 NOTE — Telephone Encounter (Signed)
Will route to [employer], [place]  They called to notify that monitor results showed that patient has been in sinus rhythm, but had something show up that looked "strange" they are going to send [place] the report so we can rule out a run of vtach as they can not rule this out, patient is back in normal sinus.   Did not see patient was a patient of our office, and no upcoming visit scheduled.  Thanks!

## 2021-04-10 NOTE — Telephone Encounter (Signed)
I also called patient to check in. Patient is feeling fine, she has had no symptoms (no chest pain, palpitations, dizziness) patient states she did change out her battery on Monday, and this may have caused some of the issues.   Again, report will be sent over to rule out anything else.  Patient appreciative for call, request that monitor results when she is done with it be sent over to her PCP.

## 2021-04-15 NOTE — Telephone Encounter (Signed)
LVM for patient to return call in following up with Dr. Marlis Edelson request.

## 2021-04-16 ENCOUNTER — Encounter: Payer: Self-pay | Admitting: Emergency Medicine

## 2021-04-16 NOTE — Telephone Encounter (Signed)
LVM for patient to return call re: follow up on Dr. Marlis Edelson recommendation/concern.  Will send letter to address on file concerning message.

## 2021-04-24 ENCOUNTER — Other Ambulatory Visit: Payer: Self-pay | Admitting: Physician Assistant

## 2021-04-24 DIAGNOSIS — I63512 Cerebral infarction due to unspecified occlusion or stenosis of left middle cerebral artery: Secondary | ICD-10-CM

## 2021-04-24 DIAGNOSIS — I4891 Unspecified atrial fibrillation: Secondary | ICD-10-CM

## 2022-01-11 IMAGING — CT CT HEAD W/O CM
3 series · 15 of 47 positions shown, 18 images · non-contrast
Comparison: Head CT July 18, 2019

CLINICAL DATA: Concern for transient ischemic attack, difficulty
talking earlier today

EXAM:
CT HEAD WITHOUT CONTRAST
TECHNIQUE: Contiguous axial images were obtained from the base of the skull
through the vertex without intravenous contrast.

[Series 2: head wo · axial · 0.40mm/px · z∈[-479,-354]mm · 9 of 30 slices shown, 12 images]
[im 3/30  brain]
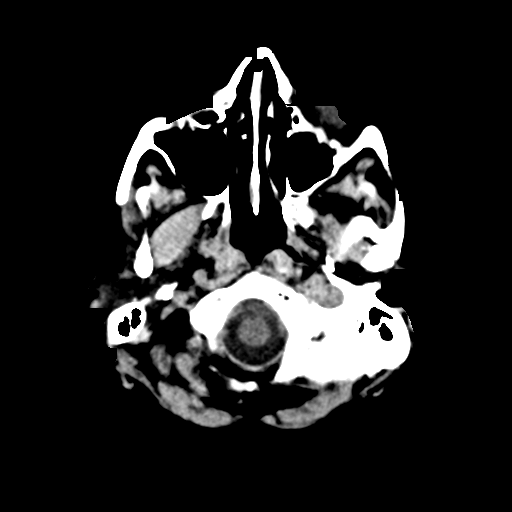
[im 3/30  bone]
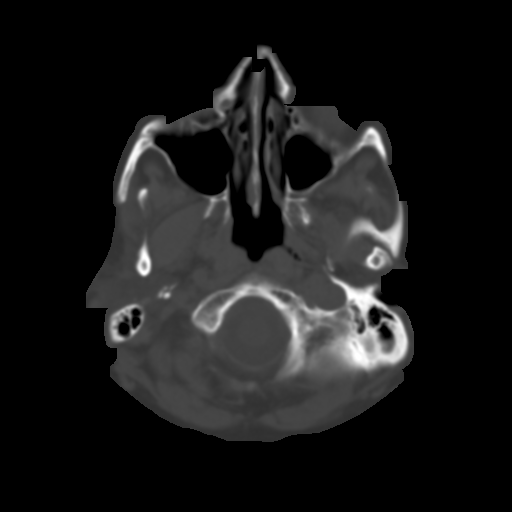
[im 6/30  brain]
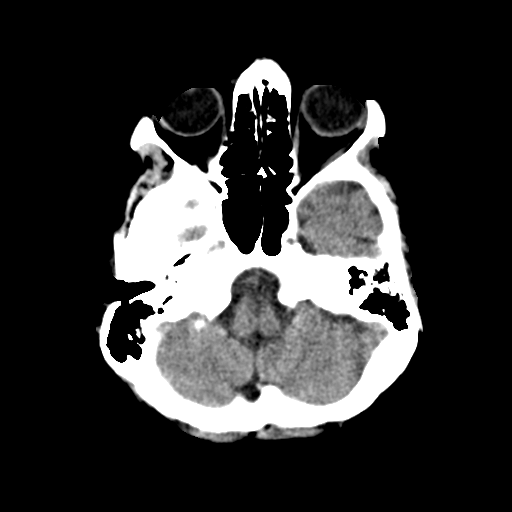
[im 9/30  brain]
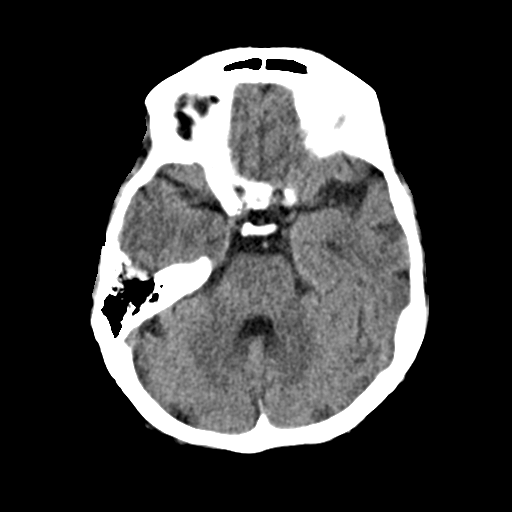
[im 12/30  brain]
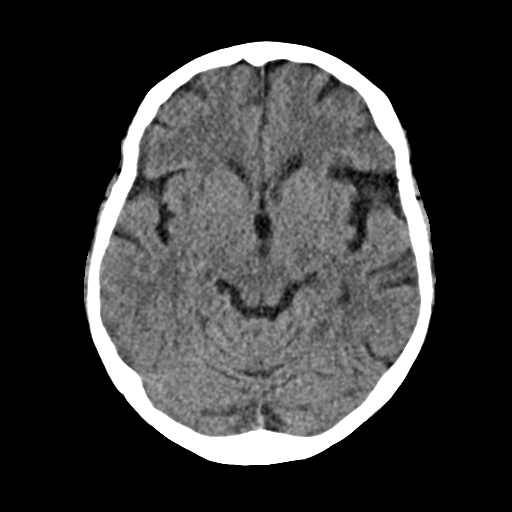
[im 16/30  brain]
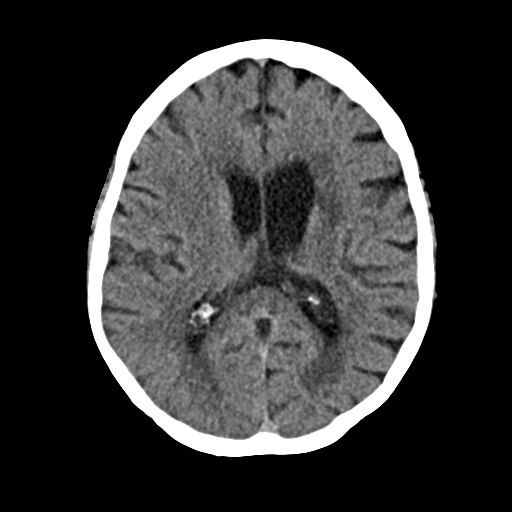
[im 16/30  bone]
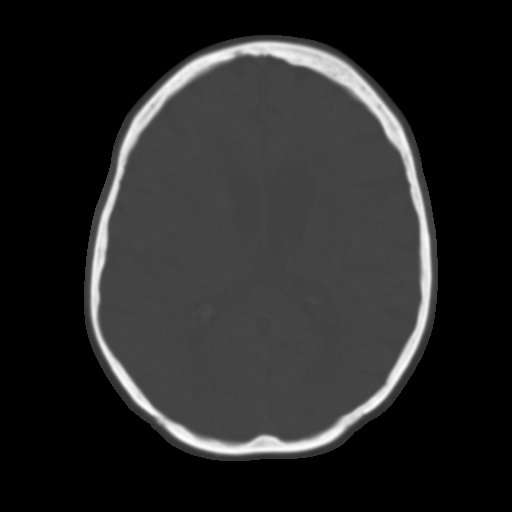
[im 19/30  brain]
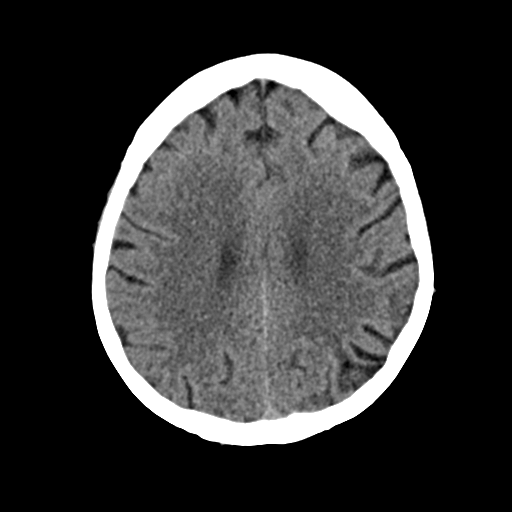
[im 22/30  brain]
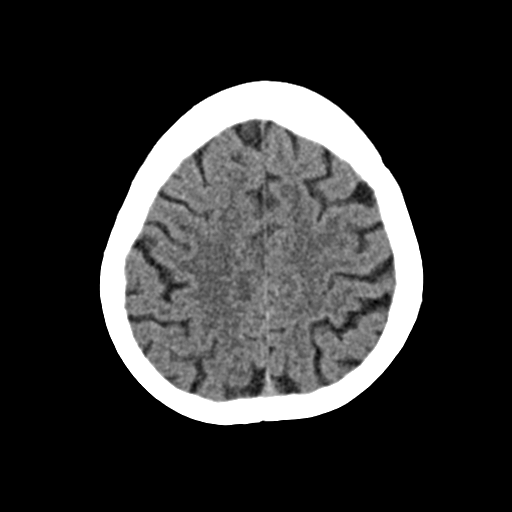
[im 25/30  brain]
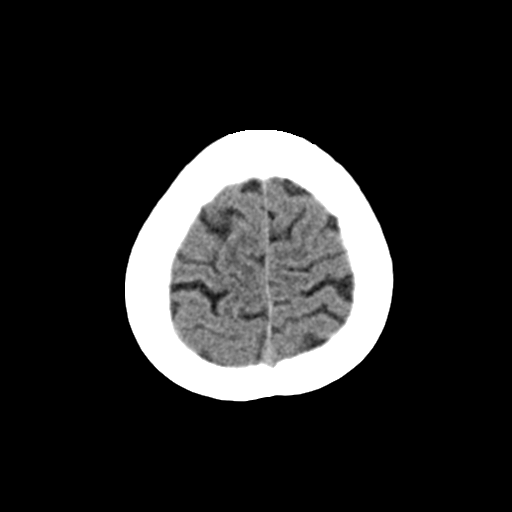
[im 28/30  brain]
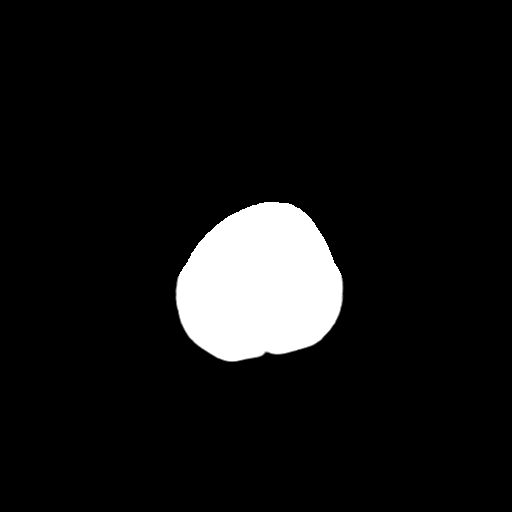
[im 28/30  bone]
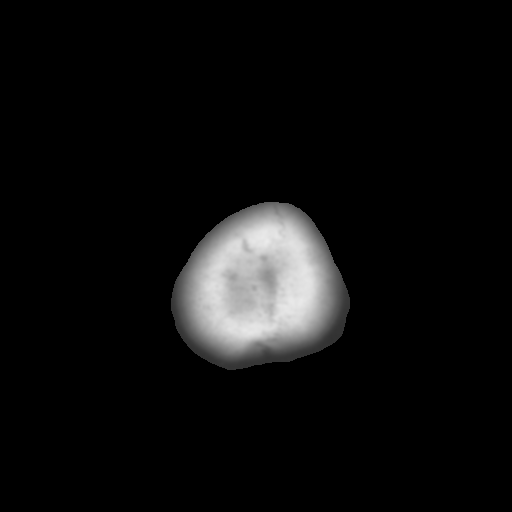

[Series 4: coronal soft · coronal · 0.33mm/px · 3 of 63 slices shown]
[im 21/63  brain]
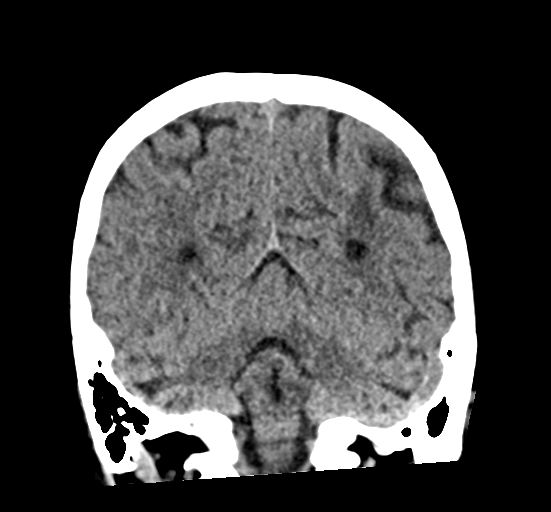
[im 28/63  brain]
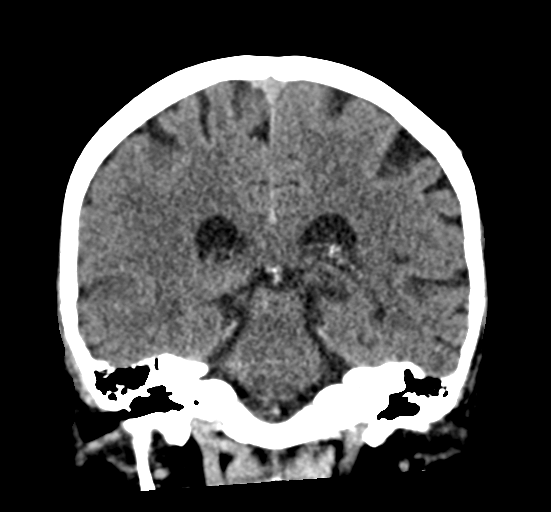
[im 35/63  brain]
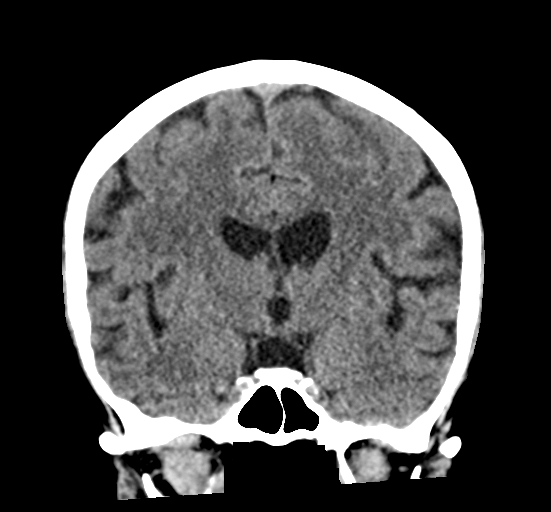

[Series 5: sag soft · sagittal · 0.33mm/px · 3 of 56 slices shown]
[im 19/56  brain]
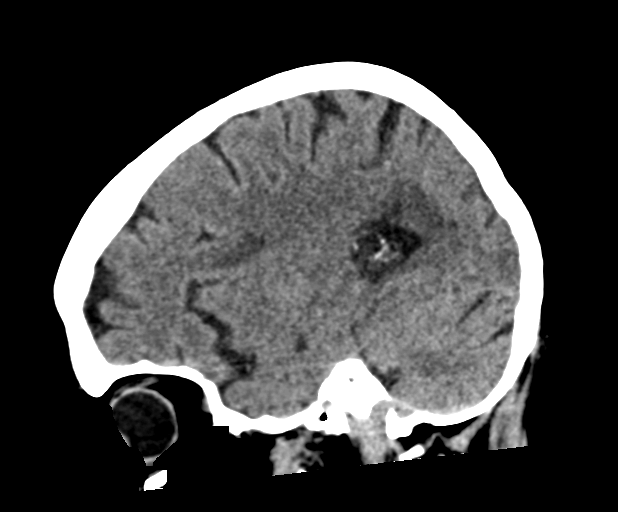
[im 28/56  brain]
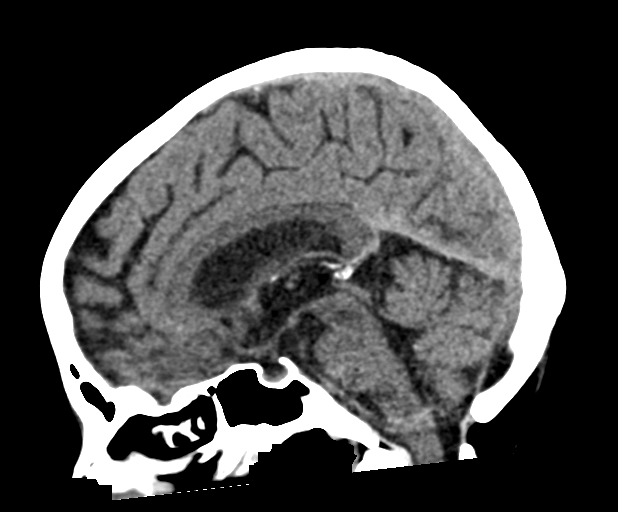
[im 37/56  brain]
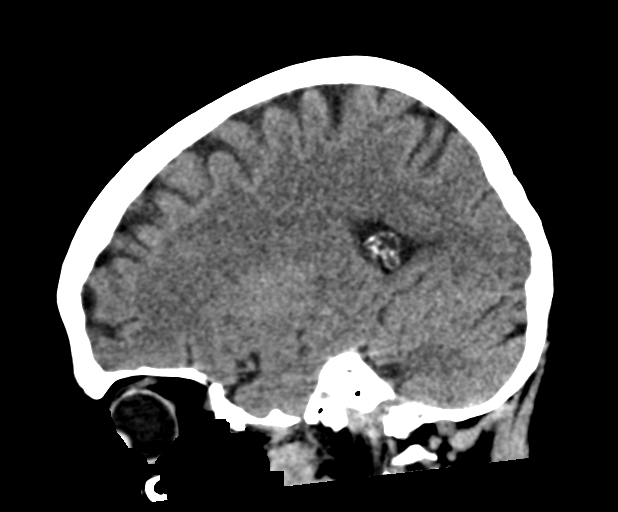

[15 of 47 positions shown; findings below may reference images not displayed]

FINDINGS: Brain: No evidence of acute large vascular territory infarction,
hemorrhage, hydrocephalus, extra-axial collection or mass
lesion/mass effect. Stable age related global parenchymal volume
loss. Unchanged slight enlargement of the left lateral ventricle in
comparison to the right. Prior infarcts involving the anterior limb
of the left internal and external capsules extending into the
adjacent left superior lentiform nucleus and centrum semiovale.
Prior lacunar type infarct in the left centrum semiovale, stable.
Similar mild burden of chronic microvascular ischemic white matter
disease.

Vascular: No hyperdense vessel. Atherosclerotic calcifications of
the internal carotid arteries at the skull base.

Skull: Normal. Negative for fracture or focal lesion.

Sinuses/Orbits: The paranasal sinuses and mastoid air cells are
predominantly clear. Orbits are grossly unremarkable.

Other: None
IMPRESSION: 1. No acute intracranial abnormality.
2. Stable age related global parenchymal volume loss, mild burden of
chronic microvascular ischemic white matter disease, and chronic
infarcts in the left basal ganglia and centrum semiovale.
